# Patient Record
Sex: Male | Born: 1960 | Race: Black or African American | Hispanic: No | Marital: Single | Smoking: Current every day smoker
Health system: Southern US, Community
[De-identification: ages and names within clinical notes are randomized; demographics above are authoritative.]

## PROBLEM LIST (undated history)

## (undated) DIAGNOSIS — M109 Gout, unspecified: Secondary | ICD-10-CM

## (undated) DIAGNOSIS — M1990 Unspecified osteoarthritis, unspecified site: Secondary | ICD-10-CM

## (undated) DIAGNOSIS — K859 Acute pancreatitis without necrosis or infection, unspecified: Secondary | ICD-10-CM

## (undated) HISTORY — PX: WRIST SURGERY: SHX841

---

## 2004-06-06 ENCOUNTER — Emergency Department: Payer: Self-pay | Admitting: Emergency Medicine

## 2004-07-08 ENCOUNTER — Ambulatory Visit: Payer: Self-pay | Admitting: Unknown Physician Specialty

## 2004-08-24 ENCOUNTER — Emergency Department: Payer: Self-pay | Admitting: Emergency Medicine

## 2007-12-14 ENCOUNTER — Emergency Department: Payer: Self-pay

## 2008-12-11 ENCOUNTER — Encounter: Payer: Self-pay | Admitting: Orthopaedic Surgery

## 2008-12-24 ENCOUNTER — Encounter: Payer: Self-pay | Admitting: Orthopaedic Surgery

## 2009-01-24 ENCOUNTER — Encounter: Payer: Self-pay | Admitting: Orthopaedic Surgery

## 2010-05-14 ENCOUNTER — Emergency Department: Payer: Self-pay | Admitting: Emergency Medicine

## 2011-10-12 ENCOUNTER — Emergency Department: Payer: Self-pay | Admitting: Emergency Medicine

## 2011-10-12 LAB — COMPREHENSIVE METABOLIC PANEL
Albumin: 2.8 g/dL — ABNORMAL LOW (ref 3.4–5.0)
Alkaline Phosphatase: 91 U/L (ref 50–136)
Anion Gap: 8 (ref 7–16)
BUN: 7 mg/dL (ref 7–18)
Co2: 26 mmol/L (ref 21–32)
Creatinine: 0.89 mg/dL (ref 0.60–1.30)
EGFR (African American): >60
EGFR (Non-African Amer.): >60
Glucose: 114 mg/dL — ABNORMAL HIGH (ref 65–99)
Osmolality: 263 (ref 275–301)
SGPT (ALT): 22 U/L (ref 12–78)
Sodium: 132 mmol/L — ABNORMAL LOW (ref 136–145)
Total Protein: 9.1 g/dL — ABNORMAL HIGH (ref 6.4–8.2)

## 2011-10-12 LAB — URINALYSIS, COMPLETE
Bilirubin,UR: NEGATIVE
Nitrite: NEGATIVE
Ph: 5 (ref 4.5–8.0)
RBC,UR: NONE SEEN /HPF (ref 0–5)
Specific Gravity: 1.016 (ref 1.003–1.030)
Squamous Epithelial: NONE SEEN

## 2011-10-12 LAB — CBC
HCT: 41 % (ref 40.0–52.0)
HGB: 14 g/dL (ref 13.0–18.0)
MCH: 32.5 pg (ref 26.0–34.0)
MCV: 95 fL (ref 80–100)
Platelet: 336 10*3/uL (ref 150–440)
RDW: 14.4 % (ref 11.5–14.5)
WBC: 17.6 10*3/uL — ABNORMAL HIGH (ref 3.8–10.6)

## 2011-10-12 LAB — LIPASE, BLOOD: Lipase: 92 U/L (ref 73–393)

## 2011-10-14 LAB — URINE CULTURE

## 2012-08-28 ENCOUNTER — Emergency Department: Payer: Self-pay | Admitting: Emergency Medicine

## 2012-08-29 LAB — URIC ACID: Uric Acid: 8.2 mg/dL — ABNORMAL HIGH (ref 3.5–7.2)

## 2014-03-09 ENCOUNTER — Emergency Department: Payer: Self-pay | Admitting: Internal Medicine

## 2014-03-14 ENCOUNTER — Emergency Department: Payer: Self-pay | Admitting: Emergency Medicine

## 2014-03-17 ENCOUNTER — Emergency Department: Payer: Self-pay | Admitting: Emergency Medicine

## 2014-03-24 ENCOUNTER — Emergency Department: Payer: Self-pay | Admitting: Emergency Medicine

## 2014-03-26 ENCOUNTER — Emergency Department: Payer: Self-pay | Admitting: Emergency Medicine

## 2014-04-07 ENCOUNTER — Emergency Department: Payer: Self-pay | Admitting: Emergency Medicine

## 2014-05-13 NOTE — Consult Note (Signed)
Brief Consult Note: Diagnosis: epididymoorchitis.   Patient was seen by consultant.   Consult note dictated.   Recommend further assessment or treatment.   Discussed with Attending MD.   Comments: Needs GI f/u for colonoscopy for constip and bloody stools. Rx eporch rec [facility] f/u 10 days.  Electronic Signatures: Lattie Hawooper, Ricka Westra E (MD)  (Signed 19-Sep-13 00:46)  Authored: Brief Consult Note   Last Updated: 19-Sep-13 00:46 by Lattie Hawooper, Nilsa Macht E (MD)

## 2014-05-13 NOTE — Consult Note (Signed)
PATIENT NAME:  Philip Daniel, Philip Daniel MR#:  [id number] DATE OF BIRTH:  1960/12/01  DATE OF CONSULTATION:  10/13/2011  REFERRING PHYSICIAN:  Dorothea GlassmanPaul Malinda, MD  CONSULTING PHYSICIAN:  Adah Salvageichard E. Excell Seltzerooper, MD  CHIEF COMPLAINT: Bloody stools.   HISTORY OF PRESENT ILLNESS: This is a patient who has not had a bowel movement for three days and has had some minimal blood, bright red per rectum; but he also incidentally mentions that over the last four or five days he has had left groin pain. Dr. Darnelle CatalanMalinda called me to assist in the work-up of this patient, whose primary reason for coming to the Emergency Room was bloody bowel movements or bloody discharge and constipation, but who also has left groin pain. He has had a left inguinal hernia repair laparoscopically several years ago. He denies fevers, chills. No nausea or vomiting. He has not a bowel movement since Sunday.   PAST MEDICAL HISTORY: None.   PAST SURGICAL HISTORY: Left inguinal hernia repaired laparoscopically by Dr. Okey Duprerawford.   FAMILY HISTORY: Noncontributory.   SOCIAL HISTORY: The patient is unemployed. He smokes tobacco. He drinks alcohol often daily. He has never had a colonoscopy.   REVIEW OF SYSTEMS: Ten-system review is performed and negative with the exception of that mentioned in the history of present illness.   ALLERGIES: None.   MEDICATIONS: None.  PHYSICAL EXAMINATION:  GENERAL: A comfortable-appearing black male patient.   VITAL SIGNS: Temperature 96.9, pulse 97, respirations 18, blood pressure 120/81, 96% room air saturation. Pain scale was 10, it is much better now, he states possibly a 6.   HEENT: No scleral icterus. Poor dentition.  NECK: No palpable neck nodes.    CHEST: Clear to auscultation.   CARDIAC: Regular rate and rhythm.   ABDOMEN: Soft, nondistended, and nontender.   EXTREMITIES: Without edema. Calves are nontender.   NEUROLOGIC: Grossly intact.   RECTAL: Exam was performed by Dr. Darnelle CatalanMalinda and demonstrated  bloody mucus with guaiac-positive stools.   GENITOURINARY: Exam demonstrates no tenderness at the internal ring, but the cord down in the scrotum on the left side and the testicle are exquisitely tender and edematous and indurated. The overlying scrotum is not edematous or indurated and is soft and normal-appearing without erythema.   LABORATORY, DIAGNOSTIC AND RADIOLOGICAL DATA: CT scan is personally reviewed suggesting epididymoorchitis. There is also a recurrent left inguinal hernia that does not contain bowel. Urinalysis shows 2+ blood, 3+ leukocyte esterase. Ultrasound of the testicle shows normal flow. Electrolytes are within normal limits. White blood cell count 17.6, hemoglobin and hematocrit of 14 and 41.   ASSESSMENT AND PLAN: This is a patient with classic symptoms and signs of epididymoorchitis. He does have a recurrent inguinal hernia after laparoscopic repair. It does not contain bowel and is soft and nontender. His bigger problem is that of bloody stools. He has never had a colonoscopy. I recommended that he be arranged to have an outpatient GI consultation for colonoscopy. This was discussed with Dr. Darnelle CatalanMalinda, who agreed to arrange for outpatient GI consultation; and I also suggested both anti-inflammatories and antibiotics for the epididymoorchitis with follow-up in my office in 10 days.      The patient was in agreement with this, and Dr. Darnelle CatalanMalinda will make arrangements. ____________________________ Adah Salvageichard E. Excell Seltzerooper, MD rec:cbb D: 10/13/2011 00:51:48 ET T: 10/13/2011 13:27:50 ET JOB#: [id number]  cc: Adah Salvageichard E. Excell Seltzerooper, MD, <Dictator> Lattie HawICHARD E Daveon Arpino MD ELECTRONICALLY SIGNED 10/13/2011 19:18

## 2014-07-22 ENCOUNTER — Encounter: Payer: Self-pay | Admitting: Emergency Medicine

## 2014-07-22 ENCOUNTER — Emergency Department: Payer: Self-pay

## 2014-07-22 ENCOUNTER — Emergency Department
Admission: EM | Admit: 2014-07-22 | Discharge: 2014-07-22 | Disposition: A | Discharge status: Home / Self Care | Payer: Self-pay | Attending: Emergency Medicine | Admitting: Emergency Medicine

## 2014-07-22 DIAGNOSIS — Z720 Tobacco use: Secondary | ICD-10-CM | POA: Insufficient documentation

## 2014-07-22 DIAGNOSIS — M1712 Unilateral primary osteoarthritis, left knee: Secondary | ICD-10-CM | POA: Insufficient documentation

## 2014-07-22 DIAGNOSIS — M7122 Synovial cyst of popliteal space [Baker], left knee: Secondary | ICD-10-CM | POA: Insufficient documentation

## 2014-07-22 MED ORDER — PREDNISONE 20 MG PO TABS
60.0000 mg | ORAL_TABLET | Freq: Once | ORAL | Status: AC
  Administered 2014-07-22: 60 mg via ORAL

## 2014-07-22 MED ORDER — KETOROLAC TROMETHAMINE 60 MG/2ML IM SOLN
INTRAMUSCULAR | Status: AC
  Administered 2014-07-22: 60 mg via INTRAMUSCULAR
  Filled 2014-07-22: qty 2

## 2014-07-22 MED ORDER — HYDROCODONE-ACETAMINOPHEN 5-325 MG PO TABS
ORAL_TABLET | ORAL | Status: AC
  Administered 2014-07-22: 1 via ORAL
  Filled 2014-07-22: qty 1

## 2014-07-22 MED ORDER — KETOROLAC TROMETHAMINE 60 MG/2ML IM SOLN
60.0000 mg | Freq: Once | INTRAMUSCULAR | Status: AC
  Administered 2014-07-22: 60 mg via INTRAMUSCULAR

## 2014-07-22 MED ORDER — ORPHENADRINE CITRATE 30 MG/ML IJ SOLN
INTRAMUSCULAR | Status: AC
  Filled 2014-07-22: qty 2

## 2014-07-22 MED ORDER — HYDROCODONE-ACETAMINOPHEN 5-325 MG PO TABS
1.0000 | ORAL_TABLET | Freq: Once | ORAL | Status: AC
  Administered 2014-07-22: 1 via ORAL

## 2014-07-22 MED ORDER — IBUPROFEN 800 MG PO TABS: 800.0000 mg | ORAL_TABLET | Freq: Three times a day (TID) | ORAL | Status: DC | PRN

## 2014-07-22 MED ORDER — TRAMADOL HCL 50 MG PO TABS: 50.0000 mg | ORAL_TABLET | Freq: Four times a day (QID) | ORAL | Status: DC | PRN

## 2014-07-22 MED ORDER — PREDNISONE 20 MG PO TABS
ORAL_TABLET | ORAL | Status: AC
  Administered 2014-07-22: 60 mg via ORAL
  Filled 2014-07-22: qty 3

## 2014-07-22 NOTE — ED Notes (Signed)
Pt to ed with c/o left knee pain and swelling x 3 weeks.  Pt states unknown injury.

## 2014-07-22 NOTE — ED Notes (Signed)
Patient transported to X-ray 

## 2014-07-22 NOTE — ED Notes (Signed)
Left knee pain past 3 weeks

## 2014-07-22 NOTE — ED Provider Notes (Signed)
[facility] Emergency Department Provider Note  ____________________________________________  Time seen: 1212 I have reviewed the triage vital signs and the nursing notes.   HISTORY  Chief Complaint Knee Pain and Leg Swelling    HPI Philip EagleBarry W Davie Sr. is a 54 y.o. male arrives here today with a complaint of swelling around his left knee in the mass at the back of his left knee since he denies any trauma or injuries says it's been worseningover the past 3 weeks is here today for further evaluation and treatment rates pain as approximately a 8 out of 10 nothing making it particularly better or worse early has no other symptoms at this time denies lower extremity edema states that he sits [place] Time though he does develop a lot of pain   History reviewed. No pertinent past medical history.  There are no active problems to display for this patient.   History reviewed. No pertinent past surgical history.  Current Outpatient Rx  Name  Route  Sig  Dispense  Refill  . ibuprofen (ADVIL,MOTRIN) 800 MG tablet   Oral   Take 1 tablet (800 mg total) by mouth every 8 (eight) hours as needed.   60 tablet   0   . traMADol (ULTRAM) 50 MG tablet   Oral   Take 1 tablet (50 mg total) by mouth every 6 (six) hours as needed.   12 tablet   0     Allergies Review of patient's allergies indicates no known allergies.  No family history on file.  Social History History  Substance Use Topics  . Smoking status: Current Every Day Smoker  . Smokeless tobacco: Not on file  . Alcohol Use: Yes    Review of Systems Constitutional: No fever/chills Eyes: No visual changes. ENT: No sore throat. Cardiovascular: Denies chest pain. Respiratory: Denies shortness of breath. Gastrointestinal: No abdominal pain.  No nausea, no vomiting.  No diarrhea.  No constipation. Genitourinary: Negative for dysuria. Musculoskeletal: Negative for back pain. Skin: Negative for  rash. Neurological: Negative for headaches, focal weakness or numbness.  10-point ROS otherwise negative.  ____________________________________________   PHYSICAL EXAM:  VITAL SIGNS: ED Triage Vitals  Enc Vitals Group     BP 07/22/14 1201 133/83 mmHg     Pulse Rate 07/22/14 1201 94     Resp 07/22/14 1201 18     Temp 07/22/14 1201 98.1 F (36.7 C)     Temp Source 07/22/14 1201 Oral     SpO2 07/22/14 1201 100 %     Weight --      Height --      Head Cir --      Peak Flow --      Pain Score 07/22/14 1149 9     Pain Loc --      Pain Edu? --      Excl. in GC? --     Constitutional: Alert and oriented. Well appearing and in no acute distress. Eyes: Conjunctivae are normal. PERRL. EOMI. Head: Atraumatic. Nose: No congestion/rhinnorhea. Mouth/Throat: Mucous membranes are moist.  Oropharynx non-erythematous. Neck: No stridor.   Cardiovascular: Normal rate, regular rhythm. Grossly normal heart sounds.  Good peripheral circulation. Respiratory: Normal respiratory effort.  No retractions. Lungs CTAB. Gastrointestinal: Soft and nontender. No distention. No abdominal bruits. No CVA tenderness. Musculoskeletal: No lower extremity tenderness nor edema.  No joint effusions. Neurologic:  Normal speech and language. No gross focal neurologic deficits are appreciated. Speech is normal. No gait instability. Skin:  Skin is warm, dry and intact. No rash noted. Psychiatric: Mood and affect are normal. Speech and behavior are normal.  ____________________________________________   LABS (all labs ordered are listed, but only abnormal results are displayed)  Labs Reviewed - No data to display ____________________________________________  EKG   ____________________________________________  RADIOLOGY  X-rays the patient's knee showed  IMPRESSION: 1. Moderate knee effusion. 2. 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   Electronically Signed By: Philip Daniel M.D. On: 07/22/2014 13:19  Patient's ultrasound positive for Baker's cyst  I, Philip Daniel,  Philip Daniel, Philip Daniel, personally viewed and evaluated these images as part of my medical decision making.  ____________________________________________   PROCEDURES  Procedure(s) performed: Ace wrap was applied to the patient's left knee  Critical Care performed: No  ____________________________________________   INITIAL IMPRESSION / ASSESSMENT AND PLAN / ED COURSE  Pertinent labs & imaging results that were available during my care of the patient were reviewed by me and considered in my medical decision making (see chart for details). Initial impression arthritis of the patient's left knee with Baker's cyst recommended he take Reglan 10 inflammatory's ice the knee try to support his leg follow-up with orthopedics for further evaluation and definitive therapy long-term return here for any acute concerns or worsening symptoms explained to the patient would've Baker's cyst was and give him information on same  _________________________________________   FINAL CLINICAL IMPRESSION(S) / ED DIAGNOSES  Final diagnoses:  Arthritis of knee, left  Baker's cyst of knee, left     Philip Nordell Rosalyn Gess, PA-C 07/22/14 1521  Philip Cheek, MD 07/22/14 [phone]

## 2014-07-22 NOTE — Discharge Instructions (Signed)
Philip Daniel °A Philip Daniel is a sac-like structure that forms in the back of the knee. It is filled with the same fluid that is located in your knee. This fluid lubricates the bones and cartilage of the knee and allows them to move over each other more easily. °CAUSES  °When the knee becomes injured or inflamed, increased fluid forms in the knee. When this happens, the joint lining is pushed out behind the knee and forms the Philip Daniel. This Daniel may also be caused by inflammation from arthritic conditions and infections. °SIGNS AND SYMPTOMS  °A Philip Daniel usually has no symptoms. When the Daniel is substantially enlarged: °· You may feel pressure behind the knee, stiffness in the knee, or a mass in the area behind the knee. °· You may develop pain, redness, and swelling in the calf.  This can suggest a blood clot and requires evaluation by your health care provider. °DIAGNOSIS  °A Philip Daniel is most often found during an ultrasound exam. This exam may have been performed for other reasons, and the Daniel was found incidentally. Sometimes an MRI is used. This picks up other problems within a joint that an ultrasound exam may not. If the Philip Daniel developed immediately after an injury, X-ray exams may be used to diagnose the Daniel. °TREATMENT  °The treatment depends on the cause of the Daniel. Anti-inflammatory medicines and rest often will be prescribed. If the Daniel is caused by a bacterial infection, antibiotic medicines may be prescribed.  °HOME CARE INSTRUCTIONS  °· If the Daniel was caused by an injury, for the first 24 hours, keep the injured leg elevated on 2 pillows while lying down. °· For the first 24 hours while you are awake, apply ice to the injured area: °¨ Put ice in a plastic bag. °¨ Place a towel between your skin and the bag. °¨ Leave the ice on for 20 minutes, 2-3 times a day. °· Only take over-the-counter or prescription medicines for pain, discomfort, or fever as directed by your health care  provider. °· Only take antibiotic medicine as directed. Make sure to finish it even if you start to feel better. °MAKE SURE YOU:  °· Understand these instructions. °· Will watch your condition. °· Will get help right away if you are not doing well or get worse. °Document Released: 01/10/2005 Document Revised: 10/31/2012 Document Reviewed: 08/22/2012 °ExitCare® Patient Information ©2015 [employer]. This information is not intended to replace advice given to you by your health care provider. Make sure you discuss any questions you have with your health care provider. ° °

## 2014-12-16 ENCOUNTER — Encounter: Payer: Self-pay | Admitting: Emergency Medicine

## 2014-12-16 ENCOUNTER — Emergency Department
Admission: EM | Admit: 2014-12-16 | Discharge: 2014-12-16 | Disposition: A | Discharge status: Home / Self Care | Payer: Self-pay | Attending: Emergency Medicine | Admitting: Emergency Medicine

## 2014-12-16 DIAGNOSIS — F17200 Nicotine dependence, unspecified, uncomplicated: Secondary | ICD-10-CM | POA: Insufficient documentation

## 2014-12-16 DIAGNOSIS — L720 Epidermal cyst: Secondary | ICD-10-CM | POA: Insufficient documentation

## 2014-12-16 MED ORDER — SULFAMETHOXAZOLE-TRIMETHOPRIM 800-160 MG PO TABS: 1.0000 | ORAL_TABLET | Freq: Two times a day (BID) | ORAL | Status: DC

## 2014-12-16 MED ORDER — HYDROCODONE-ACETAMINOPHEN 5-325 MG PO TABS: 1.0000 | ORAL_TABLET | ORAL | Status: DC | PRN

## 2014-12-16 MED ORDER — LIDOCAINE-EPINEPHRINE (PF) 1 %-1:200000 IJ SOLN
INTRAMUSCULAR | Status: AC
  Administered 2014-12-16: 12:00:00
  Filled 2014-12-16: qty 30

## 2014-12-16 NOTE — ED Provider Notes (Signed)
[facility] Emergency Department Provider Note  ____________________________________________  Time seen: Approximately 1:18 PM  I have reviewed the triage vital signs and the nursing notes.   HISTORY  Chief Complaint Abscess  HPI Gareth EagleBarry W Sult Sr. is a 54 y.o. male who presents to the emergency department for evaluation of a cyst that has been present for the last several months. It has not been tender until the last few days, but now is becoming red and very sore. He does not have a history of skin infections or MRSA that he is aware of. He did have a cyst removed from his nose several years ago. He denies nausea vomiting or fever.  History reviewed. No pertinent past medical history.  There are no active problems to display for this patient.   History reviewed. No pertinent past surgical history.  Current Outpatient Rx  Name  Route  Sig  Dispense  Refill  . HYDROcodone-acetaminophen (NORCO/VICODIN) 5-325 MG tablet   Oral   Take 1 tablet by mouth every 4 (four) hours as needed.   12 tablet   0   . ibuprofen (ADVIL,MOTRIN) 800 MG tablet   Oral   Take 1 tablet (800 mg total) by mouth every 8 (eight) hours as needed.   60 tablet   0   . sulfamethoxazole-trimethoprim (BACTRIM DS,SEPTRA DS) 800-160 MG tablet   Oral   Take 1 tablet by mouth 2 (two) times daily.   20 tablet   0   . traMADol (ULTRAM) 50 MG tablet   Oral   Take 1 tablet (50 mg total) by mouth every 6 (six) hours as needed.   12 tablet   0     Allergies Review of patient's allergies indicates no known allergies.  No family history on file.  Social History Social History  Substance Use Topics  . Smoking status: Current Every Day Smoker  . Smokeless tobacco: None  . Alcohol Use: Yes    Review of Systems   Constitutional: No fever/chills Eyes: No visual changes. ENT: No congestion or rhinorrhea Cardiovascular: Denies chest pain. Respiratory: Denies shortness of  breath. Gastrointestinal: No abdominal pain.  No nausea, no vomiting.  No diarrhea.  No constipation. Genitourinary: Negative for dysuria. Musculoskeletal: Negative for back pain. Skin: Positive for abscess Neurological: Negative for headaches, focal weakness or numbness.  10-point ROS otherwise negative.  ____________________________________________   PHYSICAL EXAM:  VITAL SIGNS: ED Triage Vitals  Enc Vitals Group     BP 12/16/14 1204 142/87 mmHg     Pulse Rate 12/16/14 1204 87     Resp 12/16/14 1204 18     Temp 12/16/14 1204 98.5 F (36.9 C)     Temp Source 12/16/14 1204 Oral     SpO2 12/16/14 1204 95 %     Weight 12/16/14 1204 170 lb (77.111 kg)     Height 12/16/14 1204 5\' 10"  (1.778 m)     Head Cir --      Peak Flow --      Pain Score 12/16/14 1204 8     Pain Loc --      Pain Edu? --      Excl. in GC? --     Constitutional: Alert and oriented. Well appearing and in no acute distress. Eyes: Conjunctivae are normal. PERRL. EOMI. Head: Atraumatic. Nose: No congestion/rhinnorhea. Mouth/Throat: Mucous membranes are moist.  Oropharynx non-erythematous. No oral lesions. Neck: No stridor. Cardiovascular: Normal rate, regular rhythm.  Good peripheral circulation. Respiratory: Normal respiratory  effort.  No retractions. Lungs CTAB. Gastrointestinal: Soft and nontender. No distention. No abdominal bruits.  Musculoskeletal: No lower extremity tenderness nor edema.  No joint effusions. Neurologic:  Normal speech and language. No gross focal neurologic deficits are appreciated. Speech is normal. No gait instability. Skin:  4 cm raised epidermal cyst noted to the upper back adjacent to the right scapula with erythema, fluctuance and tenderness; Negative for petechiae.  Psychiatric: Mood and affect are normal. Speech and behavior are normal.  ____________________________________________   LABS (all labs ordered are listed, but only abnormal results are displayed)  Labs  Reviewed - No data to display ____________________________________________  EKG   ____________________________________________  RADIOLOGY   ____________________________________________   PROCEDURES  Procedure(s) performed:   Epidermal cyst was excised after cleansing the back with Betadine. #11 blade used 2 make a 1-1/2 cm incision. Cyst wall was ruptured during the process and copious amounts of purulent drainage and dark brown, thick material was expressed. The cyst wall was revealed after contents were expressed and wall was removed. There was no bleeding after the procedure. Patient tolerated the procedure well. ____________________________________________   INITIAL IMPRESSION / ASSESSMENT AND PLAN / ED COURSE  Pertinent labs & imaging results that were available during my care of the patient were reviewed by me and considered in my medical decision making (see chart for details).  Because the cyst ruptured during excision, he will be prescribed Bactrim. He will also be given a three-day dose of hydrocodone. He was advised to follow-up with the primary care provider or return to the emergency department for any symptom of concern. I did symptoms of infection were reviewed with the patient. ____________________________________________   FINAL CLINICAL IMPRESSION(S) / ED DIAGNOSES  Final diagnoses:  Epidermal cyst       Chinita Pester, FNP 12/16/14 1324  Jene Every, MD 12/16/14 1332

## 2014-12-16 NOTE — ED Notes (Signed)
Pt to ed with c/o abscess to upper back.  Pt states the area has been swollen for several months.  Pt reports only started hurting in the last few days.  Pt skin with mild redness. Tender to palpation.

## 2014-12-16 NOTE — Discharge Instructions (Signed)
Epidermal Cyst An epidermal cyst is sometimes called a sebaceous cyst, epidermal inclusion cyst, or infundibular cyst. These cysts usually contain a substance that looks "pasty" or "cheesy" and may have a bad smell. This substance is a protein called keratin. Epidermal cysts are usually found on the face, neck, or trunk. They may also occur in the vaginal area or other parts of the genitalia of both men and women. Epidermal cysts are usually small, painless, slow-growing bumps or lumps that move freely under the skin. It is important not to try to pop them. This may cause an infection and lead to tenderness and swelling. CAUSES  Epidermal cysts may be caused by a deep penetrating injury to the skin or a plugged hair follicle, often associated with acne. SYMPTOMS  Epidermal cysts can become inflamed and cause:  Redness.  Tenderness.  Increased temperature of the skin over the bumps or lumps.  Grayish-white, bad smelling material that drains from the bump or lump. DIAGNOSIS  Epidermal cysts are easily diagnosed by your caregiver during an exam. Rarely, a tissue sample (biopsy) may be taken to rule out other conditions that may resemble epidermal cysts. TREATMENT   Epidermal cysts often get better and disappear on their own. They are rarely ever cancerous.  If a cyst becomes infected, it may become inflamed and tender. This may require opening and draining the cyst. Treatment with antibiotics may be necessary. When the infection is gone, the cyst may be removed with minor surgery.  Small, inflamed cysts can often be treated with antibiotics or by injecting steroid medicines.  Sometimes, epidermal cysts become large and bothersome. If this happens, surgical removal in your caregiver's office may be necessary. HOME CARE INSTRUCTIONS  Only take over-the-counter or prescription medicines as directed by your caregiver.  Take your antibiotics as directed. Finish them even if you start to feel  better. SEEK MEDICAL CARE IF:   Your cyst becomes tender, red, or swollen.  Your condition is not improving or is getting worse.  You have any other questions or concerns. MAKE SURE YOU:  Understand these instructions.  Will watch your condition.  Will get help right away if you are not doing well or get worse.   This information is not intended to replace advice given to you by your health care provider. Make sure you discuss any questions you have with your health care provider.   Document Released: 12/12/2003 Document Revised: 04/04/2011 Document Reviewed: 07/19/2010 Elsevier Interactive Patient Education 2016 [employer]  Sebaceous Cyst Removal Sebaceous cyst removal is a procedure to remove a sac of oily material that forms under your skin (sebaceous cyst). Sebaceous cysts may also be called epidermoid cysts or keratin cysts. Normally, the skin secretes this oily material through a gland or a hair follicle. This type of cyst usually results when a skin gland or hair follicle becomes blocked. You may need this procedure if you have a sebaceous cyst that becomes large, uncomfortable, or infected. LET YOUR HEALTH CARE PROVIDER KNOW ABOUT:  Any allergies you have.  All medicines you are taking, including vitamins, herbs, eye drops, creams, and over-the-counter medicines.  Previous problems you or members of your family have had with the use of anesthetics.  Any blood disorders you have.  Previous surgeries you have had.  Medical conditions you have. RISKS AND COMPLICATIONS Generally, this is a safe procedure. However, problems may occur, including:  Developing another cyst.  Bleeding.  Infection.  Scarring. BEFORE THE PROCEDURE  Ask your health   care provider about:  Changing or stopping your regular medicines. This is especially important if you are taking diabetes medicines or blood thinners.  Taking medicines such as aspirin and ibuprofen. These medicines  can thin your blood. Do not take these medicines before your procedure if your health care provider instructs you not to.  If you have an infected cyst, you may have to take antibiotic medicines before or after the cyst removal. Take your antibiotics as directed by your health care provider. Finish all of the medicine even if you start to feel better.  Take a shower on the morning of your procedure. Your health care provider may ask you to use a germ-killing (antiseptic) soap. PROCEDURE  You will be given a medicine that numbs the area (local anesthetic).  The skin around the cyst will be cleaned with a germ-killing solution (antiseptic).  Your health care provider will make a small surgical incision over the cyst.  The cyst will be separated from the surrounding tissues that are under your skin.  If possible, the cyst will be removed undamaged (intact).  If the cyst bursts (ruptures), it will need to be removed in pieces.  After the cyst is removed, your health care provider will control any bleeding and close the incision with small stitches (sutures). Small incisions may not need sutures, and the bleeding will be controlled by applying direct pressure with gauze.  Your health care provider may apply antibiotic ointment and a light bandage (dressing) over the incision. This procedure may vary among health care providers and hospitals. AFTER THE PROCEDURE  If your cyst ruptured during surgery, you may need to take antibiotic medicine. If you were prescribed an antibiotic medicine, finish all of it even if you start to feel better.   This information is not intended to replace advice given to you by your health care provider. Make sure you discuss any questions you have with your health care provider.   Document Released: 01/08/2000 Document Revised: 01/31/2014 Document Reviewed: 09/25/2013 Elsevier Interactive Patient Education 2016 Elsevier Inc.   

## 2014-12-16 NOTE — ED Notes (Signed)
Pt c/o "cyst" to back for a couple of months that is getting worse

## 2015-07-04 ENCOUNTER — Emergency Department
Admission: EM | Admit: 2015-07-04 | Discharge: 2015-07-04 | Disposition: A | Discharge status: Home / Self Care | Payer: Self-pay | Attending: Emergency Medicine | Admitting: Emergency Medicine

## 2015-07-04 ENCOUNTER — Encounter: Payer: Self-pay | Admitting: Emergency Medicine

## 2015-07-04 ENCOUNTER — Emergency Department: Payer: Self-pay

## 2015-07-04 DIAGNOSIS — M1990 Unspecified osteoarthritis, unspecified site: Secondary | ICD-10-CM | POA: Insufficient documentation

## 2015-07-04 DIAGNOSIS — M25562 Pain in left knee: Secondary | ICD-10-CM | POA: Insufficient documentation

## 2015-07-04 DIAGNOSIS — F17210 Nicotine dependence, cigarettes, uncomplicated: Secondary | ICD-10-CM | POA: Insufficient documentation

## 2015-07-04 HISTORY — DX: Gout, unspecified: M10.9

## 2015-07-04 HISTORY — DX: Unspecified osteoarthritis, unspecified site: M19.90

## 2015-07-04 MED ORDER — IBUPROFEN 800 MG PO TABS: 800.0000 mg | ORAL_TABLET | Freq: Three times a day (TID) | ORAL | Status: DC | PRN

## 2015-07-04 MED ORDER — HYDROCODONE-ACETAMINOPHEN 5-325 MG PO TABS: 1.0000 | ORAL_TABLET | ORAL | Status: DC | PRN

## 2015-07-04 MED ORDER — OXYCODONE-ACETAMINOPHEN 5-325 MG PO TABS
2.0000 | ORAL_TABLET | Freq: Once | ORAL | Status: AC
  Administered 2015-07-04: 2 via ORAL
  Filled 2015-07-04: qty 2

## 2015-07-04 MED ORDER — CEPHALEXIN 500 MG PO CAPS: 500.0000 mg | ORAL_CAPSULE | Freq: Four times a day (QID) | ORAL | Status: DC

## 2015-07-04 NOTE — Discharge Instructions (Signed)
Cryotherapy °Cryotherapy means treatment with cold. Ice or gel packs can be used to reduce both pain and swelling. Ice is the most helpful within the first 24 to 48 hours after an injury or flare-up from overusing a muscle or joint. Sprains, strains, spasms, burning pain, shooting pain, and aches can all be eased with ice. Ice can also be used when recovering from surgery. Ice is effective, has very few side effects, and is safe for most people to use. °PRECAUTIONS  °Ice is not a safe treatment option for people with: °· Raynaud phenomenon. This is a condition affecting small blood vessels in the extremities. Exposure to cold may cause your problems to return. °· Cold hypersensitivity. There are many forms of cold hypersensitivity, including: °· Cold urticaria. Red, itchy hives appear on the skin when the tissues begin to warm after being iced. °· Cold erythema. This is a red, itchy rash caused by exposure to cold. °· Cold hemoglobinuria. Red blood cells break down when the tissues begin to warm after being iced. The hemoglobin that carry oxygen are passed into the urine because they cannot combine with blood proteins fast enough. °· Numbness or altered sensitivity in the area being iced. °If you have any of the following conditions, do not use ice until you have discussed cryotherapy with your caregiver: °· Heart conditions, such as arrhythmia, angina, or chronic heart disease. °· High blood pressure. °· Healing wounds or open skin in the area being iced. °· Current infections. °· Rheumatoid arthritis. °· Poor circulation. °· Diabetes. °Ice slows the blood flow in the region it is applied. This is beneficial when trying to stop inflamed tissues from spreading irritating chemicals to surrounding tissues. However, if you expose your skin to cold temperatures for too long or without the proper protection, you can damage your skin or nerves. Watch for signs of skin damage due to cold. °HOME CARE INSTRUCTIONS °Follow  these tips to use ice and cold packs safely. °· Place a dry or damp towel between the ice and skin. A damp towel will cool the skin more quickly, so you may need to shorten the time that the ice is used. °· For a more rapid response, add gentle compression to the ice. °· Ice for no more than 10 to 20 minutes at a time. The bonier the area you are icing, the less time it will take to get the benefits of ice. °· Check your skin after 5 minutes to make sure there are no signs of a poor response to cold or skin damage. °· Rest 20 minutes or more between uses. °· Once your skin is numb, you can end your treatment. You can test numbness by very lightly touching your skin. The touch should be so light that you do not see the skin dimple from the pressure of your fingertip. When using ice, most people will feel these normal sensations in this order: cold, burning, aching, and numbness. °· Do not use ice on someone who cannot communicate their responses to pain, such as small children or people with dementia. °HOW TO MAKE AN ICE PACK °Ice packs are the most common way to use ice therapy. Other methods include ice massage, ice baths, and cryosprays. Muscle creams that cause a cold, tingly feeling do not offer the same benefits that ice offers and should not be used as a substitute unless recommended by your caregiver. °To make an ice pack, do one of the following: °· Place crushed ice or a   bag of frozen vegetables in a sealable plastic bag. Squeeze out the excess air. Place this bag inside another plastic bag. Slide the bag into a pillowcase or place a damp towel between your skin and the bag.  Mix 3 parts water with 1 part rubbing alcohol. Freeze the mixture in a sealable plastic bag. When you remove the mixture from the freezer, it will be slushy. Squeeze out the excess air. Place this bag inside another plastic bag. Slide the bag into a pillowcase or place a damp towel between your skin and the bag. SEEK MEDICAL CARE  IF:  You develop white spots on your skin. This may give the skin a blotchy (mottled) appearance.  Your skin turns blue or pale.  Your skin becomes waxy or hard.  Your swelling gets worse. MAKE SURE YOU:   Understand these instructions.  Will watch your condition.  Will get help right away if you are not doing well or get worse.   This information is not intended to replace advice given to you by your health care provider. Make sure you discuss any questions you have with your health care provider.   Document Released: 09/06/2010 Document Revised: 01/31/2014 Document Reviewed: 09/06/2010 Elsevier Interactive Patient Education 2016 [employer]  Knee Pain Knee pain is a very common symptom and can have many causes. Knee pain often goes away when you follow your health care provider's instructions for relieving pain and discomfort at home. However, knee pain can develop into a condition that needs treatment. Some conditions may include:  Arthritis caused by wear and tear (osteoarthritis).  Arthritis caused by swelling and irritation (rheumatoid arthritis or gout).  A cyst or growth in your knee.  An infection in your knee joint.  An injury that will not heal.  Damage, swelling, or irritation of the tissues that support your knee (torn ligaments or tendinitis). If your knee pain continues, additional tests may be ordered to diagnose your condition. Tests may include X-rays or other imaging studies of your knee. You may also need to have fluid removed from your knee. Treatment for ongoing knee pain depends on the cause, but treatment may include:  Medicines to relieve pain or swelling.  Steroid injections in your knee.  Physical therapy.  Surgery. HOME CARE INSTRUCTIONS  Take medicines only as directed by your health care provider.  Rest your knee and keep it raised (elevated) while you are resting.  Do not do things that cause or worsen pain.  Avoid high-impact  activities or exercises, such as running, jumping rope, or doing jumping jacks.  Apply ice to the knee area:  Put ice in a plastic bag.  Place a towel between your skin and the bag.  Leave the ice on for 20 minutes, 2-3 times a day.  Ask your health care provider if you should wear an elastic knee support.  Keep a pillow under your knee when you sleep.  Lose weight if you are overweight. Extra weight can put pressure on your knee.  Do not use any tobacco products, including cigarettes, chewing tobacco, or electronic cigarettes. If you need help quitting, ask your health care provider. Smoking may slow the healing of any bone and joint problems that you may have. SEEK MEDICAL CARE IF:  Your knee pain continues, changes, or gets worse.  You have a fever along with knee pain.  Your knee buckles or locks up.  Your knee becomes more swollen. SEEK IMMEDIATE MEDICAL CARE IF:   Your knee  joint feels hot to the touch. °· You have chest pain or trouble breathing. °  °This information is not intended to replace advice given to you by your health care provider. Make sure you discuss any questions you have with your health care provider. °  °Document Released: 11/07/2006 Document Revised: 01/31/2014 Document Reviewed: 08/26/2013 °Elsevier Interactive Patient Education ©2016 Elsevier Inc. ° °

## 2015-07-04 NOTE — ED Provider Notes (Signed)
[facility] Emergency Department Provider Note  ____________________________________________  Time seen: Approximately 8:14 AM  I have reviewed the triage vital signs and the nursing notes.   HISTORY  Chief Complaint Knee Pain    HPI Philip EagleBarry W Elbe Sr. is a 55 y.o. male who presents emergency room for evaluation of left knee pain and swelling started 2 days ago. Patient states he usually uses knee injected with steroids about every 3 months with pressure but missed his last appointment.   Past Medical History  Diagnosis Date  . Arthritis   . Gout     There are no active problems to display for this patient.   Past Surgical History  Procedure Laterality Date  . Wrist surgery      Current Outpatient Rx  Name  Route  Sig  Dispense  Refill  . HYDROcodone-acetaminophen (NORCO) 5-325 MG tablet   Oral   Take 1-2 tablets by mouth every 4 (four) hours as needed for moderate pain.   15 tablet   0   . ibuprofen (ADVIL,MOTRIN) 800 MG tablet   Oral   Take 1 tablet (800 mg total) by mouth every 8 (eight) hours as needed.   30 tablet   0     Allergies Review of patient's allergies indicates no known allergies.  No family history on file.  Social History Social History  Substance Use Topics  . Smoking status: Current Every Day Smoker -- 1.00 packs/day    Types: Cigarettes  . Smokeless tobacco: None  . Alcohol Use: 2.4 oz/week    4 Cans of beer per week    Review of Systems Constitutional: No fever/chills Cardiovascular: Denies chest pain. Respiratory: Denies shortness of breath. Musculoskeletal: Positive for left knee pain and swelling. Skin: Negative for rash. Neurological: Negative for headaches, focal weakness or numbness.  10-point ROS otherwise negative.  ____________________________________________   PHYSICAL EXAM: BP 128/100 mmHg  Pulse 89  Temp(Src) 98.9 F (37.2 C) (Oral)  Resp 20  Ht 5\' 10"  (1.778 m)  Wt 77.111 kg   BMI 24.39 kg/m2  SpO2 96%  VITAL SIGNS: ED Triage Vitals  Enc Vitals Group     BP --      Pulse --      Resp --      Temp --      Temp src --      SpO2 --      Weight --      Height --      Head Cir --      Peak Flow --      Pain Score --      Pain Loc --      Pain Edu? --      Excl. in [place]? --     Constitutional: Alert and oriented. Well appearing and in no acute distress.   Cardiovascular: Normal rate, regular rhythm. Grossly normal heart sounds.  Good peripheral circulation. Respiratory: Normal respiratory effort.  No retractions. Lungs CTAB. Musculoskeletal: Left knee with positive edema and swelling and minimal warmth. Full range of motion, point tenderness noted. No erythema. Neurologic:  Normal speech and language. No gross focal neurologic deficits are appreciated. No gait instability. Skin:  Skin is warm, dry and intact. No rash noted. Psychiatric: Mood and affect are normal. Speech and behavior are normal.  ____________________________________________   LABS (all labs ordered are listed, but only abnormal results are displayed)  Labs Reviewed - No data to display ____________________________________________   RADIOLOGY  IMPRESSION: 1. Interval development of a large joint effusion. Differential considerations include degenerative effusion, infection (septic joint) and spontaneous hemarthrosis. 2. 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 3. Atherosclerotic vascular calcifications. ____________________________________________   PROCEDURES  Procedure(s) performed: None  Critical Care performed: No  ____________________________________________   INITIAL IMPRESSION / ASSESSMENT AND PLAN / ED COURSE  Pertinent labs & imaging results that were available during my care of the patient were reviewed by me and considered in my medical decision making (see chart  for details).  Recurrent and ongoing left knee pain. Patient currently has orthopedic doctor that he sees on a regular basis with follow-up with him. Rx given for Percocet 5/325 and ibuprofen 800 mg 3 times a day and Keflex 500 mg QID . Patient voices no other emergency medical complaints at this time. ____________________________________________   FINAL CLINICAL IMPRESSION(S) / ED DIAGNOSES  Final diagnoses:  Left knee pain     This chart was dictated using voice recognition software/Dragon. Despite best efforts to proofread, errors can occur which can change the meaning. Any change was purely unintentional.   Evangeline Dakin, PA-C 07/04/15 4098  Minna Antis, MD 07/04/15 1450

## 2015-07-04 NOTE — ED Notes (Signed)
Pt to ED today with Left knee pain and swelling that started 2 days ago.  Pt states he get "injections" in his Left knee every 3 months for the past year, but missed his last appointment.

## 2018-08-16 ENCOUNTER — Emergency Department: Payer: No Typology Code available for payment source

## 2018-08-16 ENCOUNTER — Other Ambulatory Visit: Payer: Self-pay

## 2018-08-16 ENCOUNTER — Emergency Department
Admission: EM | Admit: 2018-08-16 | Discharge: 2018-08-16 | Discharge status: Against Medical Advice | Payer: No Typology Code available for payment source | Attending: Emergency Medicine | Admitting: Emergency Medicine

## 2018-08-16 ENCOUNTER — Encounter: Payer: Self-pay | Admitting: Emergency Medicine

## 2018-08-16 DIAGNOSIS — M542 Cervicalgia: Secondary | ICD-10-CM

## 2018-08-16 DIAGNOSIS — Y999 Unspecified external cause status: Secondary | ICD-10-CM | POA: Insufficient documentation

## 2018-08-16 DIAGNOSIS — Y9389 Activity, other specified: Secondary | ICD-10-CM | POA: Insufficient documentation

## 2018-08-16 DIAGNOSIS — Z5329 Procedure and treatment not carried out because of patient's decision for other reasons: Secondary | ICD-10-CM | POA: Diagnosis not present

## 2018-08-16 DIAGNOSIS — F17210 Nicotine dependence, cigarettes, uncomplicated: Secondary | ICD-10-CM | POA: Diagnosis not present

## 2018-08-16 DIAGNOSIS — Y92410 Unspecified street and highway as the place of occurrence of the external cause: Secondary | ICD-10-CM | POA: Diagnosis not present

## 2018-08-16 DIAGNOSIS — R51 Headache: Secondary | ICD-10-CM | POA: Insufficient documentation

## 2018-08-16 DIAGNOSIS — M545 Low back pain, unspecified: Secondary | ICD-10-CM

## 2018-08-16 MED ORDER — CYCLOBENZAPRINE HCL 5 MG PO TABS: ORAL_TABLET | ORAL | 0 refills | Status: DC

## 2018-08-16 MED ORDER — OXYCODONE-ACETAMINOPHEN 5-325 MG PO TABS
1.0000 | ORAL_TABLET | Freq: Once | ORAL | Status: AC
  Administered 2018-08-16: 18:00:00 1 via ORAL
  Filled 2018-08-16: qty 1

## 2018-08-16 MED ORDER — KETOROLAC TROMETHAMINE 30 MG/ML IJ SOLN
30.0000 mg | Freq: Once | INTRAMUSCULAR | Status: AC
  Administered 2018-08-16: 30 mg via INTRAMUSCULAR
  Filled 2018-08-16: qty 1

## 2018-08-16 MED ORDER — LIDOCAINE 5 % EX PTCH: 1.0000 | MEDICATED_PATCH | Freq: Two times a day (BID) | CUTANEOUS | 0 refills | Status: DC

## 2018-08-16 MED ORDER — LIDOCAINE 5 % EX PTCH
1.0000 | MEDICATED_PATCH | CUTANEOUS | Status: DC
  Administered 2018-08-16: 1 via TRANSDERMAL
  Filled 2018-08-16: qty 1
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
## 2018-08-16 NOTE — Discharge Instructions (Signed)
I recommend that you stay for a CT scan.  You can take muscle relaxers as needed for muscle spasms.  Please return to the emergency department if pain or symptoms worsen.

## 2018-08-16 NOTE — ED Triage Notes (Addendum)
Entered in error on wrong patient
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
## 2018-08-16 NOTE — ED Notes (Signed)
Notified nurse that brother Merry Proud at [phone] needs him to call to update

## 2018-09-10 ENCOUNTER — Encounter: Payer: Self-pay | Admitting: Emergency Medicine

## 2018-09-10 ENCOUNTER — Emergency Department: Payer: Self-pay

## 2018-09-10 ENCOUNTER — Emergency Department
Admission: EM | Admit: 2018-09-10 | Discharge: 2018-09-10 | Disposition: A | Discharge status: Home / Self Care | Payer: Self-pay | Attending: Emergency Medicine | Admitting: Emergency Medicine

## 2018-09-10 DIAGNOSIS — F17210 Nicotine dependence, cigarettes, uncomplicated: Secondary | ICD-10-CM | POA: Insufficient documentation

## 2018-09-10 DIAGNOSIS — K2920 Alcoholic gastritis without bleeding: Secondary | ICD-10-CM | POA: Insufficient documentation

## 2018-09-10 HISTORY — DX: Acute pancreatitis without necrosis or infection, unspecified: K85.90

## 2018-09-10 LAB — LIPASE, BLOOD: Lipase: 63 U/L — ABNORMAL HIGH (ref 11–51)

## 2018-09-10 LAB — URINALYSIS, COMPLETE (UACMP) WITH MICROSCOPIC
Bacteria, UA: NONE SEEN
Bilirubin Urine: NEGATIVE
Glucose, UA: NEGATIVE mg/dL
Hgb urine dipstick: NEGATIVE
Ketones, ur: NEGATIVE mg/dL
Leukocytes,Ua: NEGATIVE
Nitrite: NEGATIVE
Protein, ur: NEGATIVE mg/dL
Specific Gravity, Urine: 1.009 (ref 1.005–1.030)
pH: 7 (ref 5.0–8.0)

## 2018-09-10 LAB — BASIC METABOLIC PANEL
Anion gap: 10 (ref 5–15)
BUN: 5 mg/dL — ABNORMAL LOW (ref 6–20)
CO2: 23 mmol/L (ref 22–32)
Calcium: 8.1 mg/dL — ABNORMAL LOW (ref 8.9–10.3)
Chloride: 97 mmol/L — ABNORMAL LOW (ref 98–111)
Creatinine, Ser: 0.89 mg/dL (ref 0.61–1.24)
GFR calc Af Amer: >60 mL/min (ref >60)
GFR calc non Af Amer: >60 mL/min (ref >60)
Glucose, Bld: 88 mg/dL (ref 70–99)
Potassium: 3.2 mmol/L — ABNORMAL LOW (ref 3.5–5.1)
Sodium: 130 mmol/L — ABNORMAL LOW (ref 135–145)

## 2018-09-10 LAB — CBC
HCT: 32.4 % — ABNORMAL LOW (ref 39.0–52.0)
Hemoglobin: 11.2 g/dL — ABNORMAL LOW (ref 13.0–17.0)
MCH: 32.6 pg (ref 26.0–34.0)
MCHC: 34.6 g/dL (ref 30.0–36.0)
MCV: 94.2 fL (ref 80.0–100.0)
Platelets: 328 10*3/uL (ref 150–400)
RBC: 3.44 MIL/uL — ABNORMAL LOW (ref 4.22–5.81)
RDW: 14.5 % (ref 11.5–15.5)
WBC: 7.6 10*3/uL (ref 4.0–10.5)
nRBC: 0 % (ref 0.0–0.2)

## 2018-09-10 LAB — TROPONIN I (HIGH SENSITIVITY): Troponin I (High Sensitivity): 4 ng/L (ref <18)

## 2018-09-10 MED ORDER — PEPTO-BISMOL 524 MG/30ML PO SUSP: 30.0000 mL | Freq: Four times a day (QID) | ORAL | 2 refills | Status: DC | PRN

## 2018-09-10 MED ORDER — ALUM & MAG HYDROXIDE-SIMETH 200-200-20 MG/5ML PO SUSP
30.0000 mL | Freq: Once | ORAL | Status: AC
  Administered 2018-09-10: 19:00:00 30 mL via ORAL
  Filled 2018-09-10: qty 30

## 2018-09-10 MED ORDER — LIDOCAINE VISCOUS HCL 2 % MT SOLN
15.0000 mL | Freq: Once | OROMUCOSAL | Status: AC
  Administered 2018-09-10: 20:00:00 15 mL via ORAL
  Filled 2018-09-10: qty 15

## 2018-09-10 NOTE — ED Provider Notes (Signed)
[facility] Emergency Department Provider Note   ____________________________________________    I have reviewed the triage vital signs and the nursing notes.   HISTORY  Chief Complaint Abdominal pain    HPI Philip PINN Sr. is a 59 y.o. male who presents with complaints of upper abdominal pain.  Patient reports he has had burning upper abdominal pain since this morning.  Does admit to significant alcohol consumption this weekend.  Denies dark tarry stools, no vomiting.  Has not take anything for this.  Does report a history of pancreatitis in the past.  No fevers or chills.  Denies chest pain no shortness of breath  Past Medical History:  Diagnosis Date  . Arthritis   . Gout   . Pancreatitis     There are no active problems to display for this patient.   Past Surgical History:  Procedure Laterality Date  . WRIST SURGERY      Prior to Admission medications   Medication Sig Start Date End Date Taking? Authorizing Provider  bismuth subsalicylate (PEPTO-BISMOL) 262 MG/15ML suspension Take 30 mLs by mouth 4 (four) times daily as needed. 09/10/18 09/10/19  Lavonia Drafts, MD  cyclobenzaprine (FLEXERIL) 5 MG tablet Take 1-2 tablets 3 times daily as needed 08/16/18   Laban Emperor, PA-C  lidocaine (LIDODERM) 5 % Place 1 patch onto the skin every 12 (twelve) hours. Remove & Discard patch within 12 hours or as directed by MD 08/16/18 08/16/19  Laban Emperor, PA-C     Allergies Patient has no known allergies.  No family history on file.  Social History Social History   Tobacco Use  . Smoking status: Current Every Day Smoker    Packs/day: 1.00    Types: Cigarettes  . Smokeless tobacco: Never Used  Substance Use Topics  . Alcohol use: Yes    Alcohol/week: 4.0 standard drinks    Types: 4 Cans of beer per week  . Drug use: No    Review of Systems  Constitutional: No fever/chills Eyes: No visual changes.  ENT: No sore throat.  Cardiovascular: Denies chest pain. Respiratory: Denies shortness of breath. Gastrointestinal: As above Genitourinary: Negative for dysuria. Musculoskeletal: Negative for back pain. Skin: Negative for rash. Neurological: Negative for headaches   ____________________________________________   PHYSICAL EXAM:  VITAL SIGNS: ED Triage Vitals  Enc Vitals Group     BP 09/10/18 1616 (!) 150/84     Pulse Rate 09/10/18 1616 73     Resp 09/10/18 1616 16     Temp 09/10/18 1616 98.6 F (37 C)     Temp Source 09/10/18 1616 Oral     SpO2 09/10/18 1616 99 %     Weight --      Height --      Head Circumference --      Peak Flow --      Pain Score 09/10/18 1617 10     Pain Loc --      Pain Edu? --      Excl. in [place]? --     Constitutional: Alert and oriented. Eyes: Conjunctivae are normal.  Head: Atraumatic. Nose: No congestion/rhinnorhea. Mouth/Throat: Mucous membranes are moist.   Neck:  Painless ROM Cardiovascular: Normal rate, regular rhythm. Grossly normal heart sounds.  Good peripheral circulation. Respiratory: Normal respiratory effort.  No retractions. Lungs CTAB. Gastrointestinal: Soft and nontender. No distention.  No CVA tenderness. Genitourinary: deferred Musculoskeletal: No lower extremity tenderness nor edema.  Warm and well perfused Neurologic:  Normal speech  and language. No gross focal neurologic deficits are appreciated.  Skin:  Skin is warm, dry and intact. No rash noted. Psychiatric: Mood and affect are normal. Speech and behavior are normal.  ____________________________________________   LABS (all labs ordered are listed, but only abnormal results are displayed)  Labs Reviewed  BASIC METABOLIC PANEL - Abnormal; Notable for the following components:      Result Value   Sodium 130 (*)    Potassium 3.2 (*)    Chloride 97 (*)    BUN 5 (*)    Calcium 8.1 (*)    All other components within normal limits  CBC - Abnormal; Notable for the following components:    RBC 3.44 (*)    Hemoglobin 11.2 (*)    HCT 32.4 (*)    All other components within normal limits  LIPASE, BLOOD - Abnormal; Notable for the following components:   Lipase 63 (*)    All other components within normal limits  URINALYSIS, COMPLETE (UACMP) WITH MICROSCOPIC - Abnormal; Notable for the following components:   Color, Urine YELLOW (*)    APPearance CLEAR (*)    All other components within normal limits  TROPONIN I (HIGH SENSITIVITY)   ____________________________________________  EKG  ED ECG REPORT I, Jene Everyobert Chanya Chrisley, the attending physician, personally viewed and interpreted this ECG.  Date: 09/10/2018  Rhythm: normal sinus rhythm QRS Axis: normal Intervals: Prolonged QT ST/T Wave abnormalities: normal Narrative Interpretation: no evidence of acute ischemia  ____________________________________________  RADIOLOGY  Chest x-ray unremarkable ____________________________________________   PROCEDURES  Procedure(s) performed: No  Procedures   Critical Care performed: No ____________________________________________   INITIAL IMPRESSION / ASSESSMENT AND PLAN / ED COURSE  Pertinent labs & imaging results that were available during my care of the patient were reviewed by me and considered in my medical decision making (see chart for details).  Patient presents with upper abdominal pain suspicious for gastritis especially given his history of heavy drinking this weekend.  Lab work is quite reassuring, minimally elevated lipase however doubt pancreatitis.  Had significant relief with GI cocktail.  Recommend Carafate but cost is prohibitive so he will be using Pepto-Bismol.  Return precautions discussed.  _______________________   FINAL CLINICAL IMPRESSION(S) / ED DIAGNOSES  Final diagnoses:  Acute alcoholic gastritis without hemorrhage        Note:  This document was prepared using Dragon voice recognition software and may include unintentional  dictation errors.   Jene EveryKinner, Tyqwan Pink, MD 09/10/18 2048

## 2018-09-10 NOTE — ED Triage Notes (Signed)
Patient presents to the ED with epigastric pain that got worse Saturday evening.  Patient reports history of pancreatitis.  States he last drank alcohol on Saturday.  Patient states he has had similar pain for approx. 1 week before it became severe on Saturday.  Patient states pain is epigastric and radiates into abdomen.

## 2019-05-31 ENCOUNTER — Other Ambulatory Visit: Payer: Self-pay

## 2019-05-31 ENCOUNTER — Encounter: Payer: Self-pay | Admitting: Emergency Medicine

## 2019-05-31 ENCOUNTER — Emergency Department
Admission: EM | Admit: 2019-05-31 | Discharge: 2019-05-31 | Disposition: A | Discharge status: Home / Self Care | Payer: Medicaid Other | Attending: Emergency Medicine | Admitting: Emergency Medicine

## 2019-05-31 ENCOUNTER — Emergency Department: Payer: Medicaid Other

## 2019-05-31 DIAGNOSIS — F17210 Nicotine dependence, cigarettes, uncomplicated: Secondary | ICD-10-CM | POA: Insufficient documentation

## 2019-05-31 DIAGNOSIS — K2920 Alcoholic gastritis without bleeding: Secondary | ICD-10-CM | POA: Diagnosis not present

## 2019-05-31 DIAGNOSIS — R1013 Epigastric pain: Secondary | ICD-10-CM | POA: Diagnosis present

## 2019-05-31 LAB — LIPASE, BLOOD: Lipase: 31 U/L (ref 11–51)

## 2019-05-31 LAB — CBC
HCT: 36.3 % — ABNORMAL LOW (ref 39.0–52.0)
Hemoglobin: 12.9 g/dL — ABNORMAL LOW (ref 13.0–17.0)
MCH: 33.2 pg (ref 26.0–34.0)
MCHC: 35.5 g/dL (ref 30.0–36.0)
MCV: 93.6 fL (ref 80.0–100.0)
Platelets: 225 10*3/uL (ref 150–400)
RBC: 3.88 MIL/uL — ABNORMAL LOW (ref 4.22–5.81)
RDW: 13.9 % (ref 11.5–15.5)
WBC: 6.5 10*3/uL (ref 4.0–10.5)
nRBC: 0 % (ref 0.0–0.2)

## 2019-05-31 LAB — BASIC METABOLIC PANEL
Anion gap: 11 (ref 5–15)
BUN: 7 mg/dL (ref 6–20)
CO2: 26 mmol/L (ref 22–32)
Calcium: 8.7 mg/dL — ABNORMAL LOW (ref 8.9–10.3)
Chloride: 90 mmol/L — ABNORMAL LOW (ref 98–111)
Creatinine, Ser: 0.88 mg/dL (ref 0.61–1.24)
GFR calc Af Amer: >60 mL/min (ref >60)
GFR calc non Af Amer: >60 mL/min (ref >60)
Glucose, Bld: 102 mg/dL — ABNORMAL HIGH (ref 70–99)
Potassium: 3.1 mmol/L — ABNORMAL LOW (ref 3.5–5.1)
Sodium: 127 mmol/L — ABNORMAL LOW (ref 135–145)

## 2019-05-31 LAB — TROPONIN I (HIGH SENSITIVITY): Troponin I (High Sensitivity): 3 ng/L (ref <18)

## 2019-05-31 MED ORDER — SODIUM CHLORIDE 0.9% FLUSH: 3.0000 mL | Freq: Once | INTRAVENOUS | Status: DC

## 2019-05-31 MED ORDER — PANTOPRAZOLE SODIUM 20 MG PO TBEC: 20.0000 mg | DELAYED_RELEASE_TABLET | Freq: Every day | ORAL | 1 refills | Status: DC

## 2019-05-31 MED ORDER — ONDANSETRON 4 MG PO TBDP
4.0000 mg | ORAL_TABLET | Freq: Once | ORAL | Status: AC
  Administered 2019-05-31: 16:00:00 4 mg via ORAL
  Filled 2019-05-31: qty 1

## 2019-05-31 MED ORDER — LIDOCAINE VISCOUS HCL 2 % MT SOLN
15.0000 mL | Freq: Once | OROMUCOSAL | Status: AC
  Administered 2019-05-31: 15 mL via ORAL
  Filled 2019-05-31: qty 15

## 2019-05-31 MED ORDER — ALUM & MAG HYDROXIDE-SIMETH 200-200-20 MG/5ML PO SUSP
30.0000 mL | Freq: Once | ORAL | Status: AC
  Administered 2019-05-31: 30 mL via ORAL
  Filled 2019-05-31: qty 30

## 2019-05-31 NOTE — ED Provider Notes (Signed)
[facility] Emergency Department Provider Note   ____________________________________________    I have reviewed the triage vital signs and the nursing notes.   HISTORY  Chief Complaint Chest Pain     HPI CLIFFORD BENNINGER Sr. is a 59 y.o. male with history of pancreatitis who presents with epigastric discomfort.  Patient reports this is been moderate to severe over the last 2 days.  He does admit to alcohol use.  Patient does report history of pancreatitis, he says this feels similar to pancreatitis although he notes that sometimes the pain radiates up into the center of his chest.  Denies shortness of breath.  No pleurisy.  Does not take anything for this.  No fevers chills or cough.  Review of records demonstrates that I saw the patient presenting similar back in August 2020 and he had significant provement with GI cocktail.  Past Medical History:  Diagnosis Date  . Arthritis   . Gout   . Pancreatitis     There are no problems to display for this patient.   Past Surgical History:  Procedure Laterality Date  . WRIST SURGERY      Prior to Admission medications   Medication Sig Start Date End Date Taking? Authorizing Provider  bismuth subsalicylate (PEPTO-BISMOL) 262 MG/15ML suspension Take 30 mLs by mouth 4 (four) times daily as needed. Patient not taking: Reported on 05/31/2019 09/10/18 09/10/19  Jene Every, MD  cyclobenzaprine (FLEXERIL) 5 MG tablet Take 1-2 tablets 3 times daily as needed Patient not taking: Reported on 05/31/2019 08/16/18   Enid Derry, PA-C  lidocaine (LIDODERM) 5 % Place 1 patch onto the skin every 12 (twelve) hours. Remove & Discard patch within 12 hours or as directed by MD Patient not taking: Reported on 05/31/2019 08/16/18 08/16/19  Enid Derry, PA-C  pantoprazole (PROTONIX) 20 MG tablet Take 1 tablet (20 mg total) by mouth daily. 05/31/19 05/30/20  Jene Every, MD     Allergies Patient has no known  allergies.  History reviewed. No pertinent family history.  Social History Social History   Tobacco Use  . Smoking status: Current Every Day Smoker    Packs/day: 1.00    Types: Cigarettes  . Smokeless tobacco: Never Used  Substance Use Topics  . Alcohol use: Yes    Alcohol/week: 4.0 standard drinks    Types: 4 Cans of beer per week  . Drug use: No    Review of Systems  Constitutional: No fever/chills Eyes: No visual changes.  ENT: No sore throat. Cardiovascular: Denies chest pain. Respiratory: Denies shortness of breath. Gastrointestinal: As above Genitourinary: Negative for dysuria. Musculoskeletal: Negative for back pain. Skin: Negative for rash. Neurological: Negative for headaches    ____________________________________________   PHYSICAL EXAM:  VITAL SIGNS: ED Triage Vitals  Enc Vitals Group     BP 05/31/19 1328 130/83     Pulse Rate 05/31/19 1328 87     Resp 05/31/19 1328 18     Temp 05/31/19 1427 97.7 F (36.5 C)     Temp Source 05/31/19 1427 Oral     SpO2 05/31/19 1328 97 %     Weight 05/31/19 1322 67.1 kg (148 lb)     Height 05/31/19 1322 1.778 m (5\' 10" )     Head Circumference --      Peak Flow --      Pain Score 05/31/19 1321 10     Pain Loc --      Pain Edu? --  Excl. in [place]? --     Constitutional: Alert and oriented.   Nose: No congestion/rhinnorhea. Mouth/Throat: Mucous membranes are moist.    Cardiovascular: Normal rate, regular rhythm.   Good peripheral circulation. Respiratory: Normal respiratory effort.  No retractions. Lungs CTAB. Gastrointestinal: Mild epigastric tenderness, no distention, no CVA tenderness Musculoskeletal:  Warm and well perfused Neurologic:  Normal speech and language. No gross focal neurologic deficits are appreciated.  Skin:  Skin is warm, dry and intact. No rash noted. Psychiatric: Mood and affect are normal. Speech and behavior are normal.  ____________________________________________   LABS (all  labs ordered are listed, but only abnormal results are displayed)  Labs Reviewed  BASIC METABOLIC PANEL - Abnormal; Notable for the following components:      Result Value   Sodium 127 (*)    Potassium 3.1 (*)    Chloride 90 (*)    Glucose, Bld 102 (*)    Calcium 8.7 (*)    All other components within normal limits  CBC - Abnormal; Notable for the following components:   RBC 3.88 (*)    Hemoglobin 12.9 (*)    HCT 36.3 (*)    All other components within normal limits  LIPASE, BLOOD  TROPONIN I (HIGH SENSITIVITY)   ____________________________________________  EKG  ED ECG REPORT I, Lavonia Drafts, the attending physician, personally viewed and interpreted this ECG.  Date: 05/31/2019 * Rhythm: normal sinus rhythm QRS Axis: normal Intervals: Abnormal ST/T Wave abnormalities: normal Narrative Interpretation: no evidence of acute ischemia  ____________________________________________  RADIOLOGY  Chest x-ray reviewed by me, unremarkable, no pneumonia or infiltrate ____________________________________________   PROCEDURES  Procedure(s) performed: No  Procedures   Critical Care performed: No ____________________________________________   INITIAL IMPRESSION / ASSESSMENT AND PLAN / ED COURSE  Pertinent labs & imaging results that were available during my care of the patient were reviewed by me and considered in my medical decision making (see chart for details).  Patient overall well-appearing in no acute distress complaining of epigastric discomfort.  Differential includes pancreatitis, gastritis, GERD/PUD.  Similar presentation to when I last saw the patient in August 2020.  His lab work today is reassuring, lipase is normal.  Will trial GI cocktail given that his presentation is suspicious for gastritis, likely alcohol induced.  No dark stools or bloody stools.  Patient had complete resolution of discomfort with GI cocktail.  We will start the patient on Protonix,  have strongly recommended GI follow-up.    ____________________________________________   FINAL CLINICAL IMPRESSION(S) / ED DIAGNOSES  Final diagnoses:  Acute alcoholic gastritis without hemorrhage        Note:  This document was prepared using Dragon voice recognition software and may include unintentional dictation errors.   Lavonia Drafts, MD 05/31/19 [phone]

## 2019-05-31 NOTE — ED Triage Notes (Signed)
Pt to ED with c/o of chest pain that started approx 4 days ago. Pt also states he has been unable to eat anything for a week. Pt states he has a hx of pancreatitis.

## 2019-05-31 NOTE — ED Notes (Signed)
Patient ambulated to and from room commode without difficulty. 

## 2019-06-12 ENCOUNTER — Observation Stay
Admission: EM | Admit: 2019-06-12 | Discharge: 2019-06-13 | Disposition: A | Discharge status: Home / Self Care | Payer: Medicaid Other | Attending: Internal Medicine | Admitting: Internal Medicine

## 2019-06-12 ENCOUNTER — Emergency Department: Payer: Medicaid Other

## 2019-06-12 ENCOUNTER — Other Ambulatory Visit: Payer: Self-pay

## 2019-06-12 ENCOUNTER — Encounter: Payer: Self-pay | Admitting: Emergency Medicine

## 2019-06-12 DIAGNOSIS — S2239XA Fracture of one rib, unspecified side, initial encounter for closed fracture: Secondary | ICD-10-CM | POA: Diagnosis present

## 2019-06-12 DIAGNOSIS — I2510 Atherosclerotic heart disease of native coronary artery without angina pectoris: Secondary | ICD-10-CM | POA: Diagnosis not present

## 2019-06-12 DIAGNOSIS — K859 Acute pancreatitis without necrosis or infection, unspecified: Secondary | ICD-10-CM

## 2019-06-12 DIAGNOSIS — K760 Fatty (change of) liver, not elsewhere classified: Secondary | ICD-10-CM | POA: Insufficient documentation

## 2019-06-12 DIAGNOSIS — Z720 Tobacco use: Secondary | ICD-10-CM

## 2019-06-12 DIAGNOSIS — M48061 Spinal stenosis, lumbar region without neurogenic claudication: Secondary | ICD-10-CM | POA: Diagnosis not present

## 2019-06-12 DIAGNOSIS — Z79899 Other long term (current) drug therapy: Secondary | ICD-10-CM | POA: Insufficient documentation

## 2019-06-12 DIAGNOSIS — Z20822 Contact with and (suspected) exposure to covid-19: Secondary | ICD-10-CM | POA: Diagnosis not present

## 2019-06-12 DIAGNOSIS — K2980 Duodenitis without bleeding: Secondary | ICD-10-CM | POA: Insufficient documentation

## 2019-06-12 DIAGNOSIS — R1013 Epigastric pain: Secondary | ICD-10-CM

## 2019-06-12 DIAGNOSIS — X58XXXD Exposure to other specified factors, subsequent encounter: Secondary | ICD-10-CM | POA: Diagnosis not present

## 2019-06-12 DIAGNOSIS — K2920 Alcoholic gastritis without bleeding: Secondary | ICD-10-CM | POA: Insufficient documentation

## 2019-06-12 DIAGNOSIS — M5126 Other intervertebral disc displacement, lumbar region: Secondary | ICD-10-CM | POA: Diagnosis not present

## 2019-06-12 DIAGNOSIS — M1990 Unspecified osteoarthritis, unspecified site: Secondary | ICD-10-CM | POA: Insufficient documentation

## 2019-06-12 DIAGNOSIS — F1010 Alcohol abuse, uncomplicated: Secondary | ICD-10-CM | POA: Insufficient documentation

## 2019-06-12 DIAGNOSIS — R0789 Other chest pain: Secondary | ICD-10-CM | POA: Insufficient documentation

## 2019-06-12 DIAGNOSIS — E871 Hypo-osmolality and hyponatremia: Secondary | ICD-10-CM | POA: Insufficient documentation

## 2019-06-12 DIAGNOSIS — S2241XD Multiple fractures of ribs, right side, subsequent encounter for fracture with routine healing: Secondary | ICD-10-CM

## 2019-06-12 DIAGNOSIS — I700 Atherosclerosis of aorta: Secondary | ICD-10-CM | POA: Diagnosis not present

## 2019-06-12 DIAGNOSIS — K861 Other chronic pancreatitis: Secondary | ICD-10-CM | POA: Diagnosis not present

## 2019-06-12 DIAGNOSIS — F17210 Nicotine dependence, cigarettes, uncomplicated: Secondary | ICD-10-CM | POA: Insufficient documentation

## 2019-06-12 DIAGNOSIS — K852 Alcohol induced acute pancreatitis without necrosis or infection: Secondary | ICD-10-CM | POA: Diagnosis present

## 2019-06-12 DIAGNOSIS — M47816 Spondylosis without myelopathy or radiculopathy, lumbar region: Secondary | ICD-10-CM | POA: Insufficient documentation

## 2019-06-12 DIAGNOSIS — R109 Unspecified abdominal pain: Secondary | ICD-10-CM | POA: Diagnosis present

## 2019-06-12 LAB — BASIC METABOLIC PANEL
Anion gap: 11 (ref 5–15)
BUN: 7 mg/dL — ABNORMAL LOW (ref 8–23)
CO2: 23 mmol/L (ref 22–32)
Calcium: 8.9 mg/dL (ref 8.9–10.3)
Chloride: 94 mmol/L — ABNORMAL LOW (ref 98–111)
Creatinine, Ser: 0.77 mg/dL (ref 0.61–1.24)
GFR calc Af Amer: >60 mL/min (ref >60)
GFR calc non Af Amer: >60 mL/min (ref >60)
Glucose, Bld: 238 mg/dL — ABNORMAL HIGH (ref 70–99)
Potassium: 4 mmol/L (ref 3.5–5.1)
Sodium: 128 mmol/L — ABNORMAL LOW (ref 135–145)

## 2019-06-12 LAB — CBC
HCT: 39.2 % (ref 39.0–52.0)
Hemoglobin: 13.8 g/dL (ref 13.0–17.0)
MCH: 32.9 pg (ref 26.0–34.0)
MCHC: 35.2 g/dL (ref 30.0–36.0)
MCV: 93.3 fL (ref 80.0–100.0)
Platelets: 313 10*3/uL (ref 150–400)
RBC: 4.2 MIL/uL — ABNORMAL LOW (ref 4.22–5.81)
RDW: 14 % (ref 11.5–15.5)
WBC: 9.7 10*3/uL (ref 4.0–10.5)
nRBC: 0 % (ref 0.0–0.2)

## 2019-06-12 LAB — TROPONIN I (HIGH SENSITIVITY)
Troponin I (High Sensitivity): 3 ng/L (ref <18)
Troponin I (High Sensitivity): 4 ng/L (ref <18)

## 2019-06-12 LAB — PHOSPHORUS: Phosphorus: 3.4 mg/dL (ref 2.5–4.6)

## 2019-06-12 LAB — MAGNESIUM: Magnesium: 1.7 mg/dL (ref 1.7–2.4)

## 2019-06-12 LAB — SARS CORONAVIRUS 2 BY RT PCR (HOSPITAL ORDER, PERFORMED IN ~~LOC~~ HOSPITAL LAB): SARS Coronavirus 2: NEGATIVE

## 2019-06-12 LAB — LIPASE, BLOOD: Lipase: 32 U/L (ref 11–51)

## 2019-06-12 MED ORDER — LORAZEPAM 2 MG/ML IJ SOLN: 0.0000 mg | Freq: Two times a day (BID) | INTRAMUSCULAR | Status: DC

## 2019-06-12 MED ORDER — SODIUM CHLORIDE 0.9 % IV SOLN: INTRAVENOUS | Status: DC

## 2019-06-12 MED ORDER — IOHEXOL 300 MG/ML  SOLN
100.0000 mL | Freq: Once | INTRAMUSCULAR | Status: AC | PRN
  Administered 2019-06-12: 100 mL via INTRAVENOUS

## 2019-06-12 MED ORDER — LORAZEPAM 2 MG/ML IJ SOLN
0.0000 mg | Freq: Four times a day (QID) | INTRAMUSCULAR | Status: DC
  Filled 2019-06-12: qty 1

## 2019-06-12 MED ORDER — THIAMINE HCL 100 MG PO TABS
100.0000 mg | ORAL_TABLET | Freq: Every day | ORAL | Status: DC
  Filled 2019-06-12: qty 1

## 2019-06-12 MED ORDER — ONDANSETRON HCL 4 MG/2ML IJ SOLN
4.0000 mg | Freq: Once | INTRAMUSCULAR | Status: AC
  Administered 2019-06-12: 4 mg via INTRAVENOUS
  Filled 2019-06-12: qty 2

## 2019-06-12 MED ORDER — MORPHINE SULFATE (PF) 4 MG/ML IV SOLN
4.0000 mg | Freq: Once | INTRAVENOUS | Status: AC
  Administered 2019-06-12: 4 mg via INTRAVENOUS
  Filled 2019-06-12: qty 1

## 2019-06-12 MED ORDER — OXYCODONE HCL 5 MG PO TABS
5.0000 mg | ORAL_TABLET | Freq: Four times a day (QID) | ORAL | Status: DC | PRN
  Administered 2019-06-12: 5 mg via ORAL
  Filled 2019-06-12: qty 1

## 2019-06-12 MED ORDER — LIDOCAINE VISCOUS HCL 2 % MT SOLN
15.0000 mL | Freq: Once | OROMUCOSAL | Status: AC
  Administered 2019-06-12: 15 mL via ORAL
  Filled 2019-06-12: qty 15

## 2019-06-12 MED ORDER — ALBUTEROL SULFATE (2.5 MG/3ML) 0.083% IN NEBU: 3.0000 mL | INHALATION_SOLUTION | RESPIRATORY_TRACT | Status: DC | PRN

## 2019-06-12 MED ORDER — LORAZEPAM 1 MG PO TABS: 1.0000 mg | ORAL_TABLET | ORAL | Status: DC | PRN

## 2019-06-12 MED ORDER — SODIUM CHLORIDE 0.9 % IV BOLUS
1000.0000 mL | Freq: Once | INTRAVENOUS | Status: AC
  Administered 2019-06-12: 1000 mL via INTRAVENOUS

## 2019-06-12 MED ORDER — LORAZEPAM 2 MG/ML IJ SOLN: 1.0000 mg | INTRAMUSCULAR | Status: DC | PRN

## 2019-06-12 MED ORDER — THIAMINE HCL 100 MG/ML IJ SOLN
100.0000 mg | Freq: Every day | INTRAMUSCULAR | Status: DC
  Administered 2019-06-12 – 2019-06-13 (×2): 100 mg via INTRAVENOUS
  Filled 2019-06-12 (×2): qty 2

## 2019-06-12 MED ORDER — HYDROMORPHONE HCL 1 MG/ML IJ SOLN
1.0000 mg | INTRAMUSCULAR | Status: DC | PRN
  Administered 2019-06-12 – 2019-06-13 (×2): 1 mg via INTRAVENOUS
  Filled 2019-06-12 (×2): qty 1

## 2019-06-12 MED ORDER — NICOTINE 21 MG/24HR TD PT24
21.0000 mg | MEDICATED_PATCH | Freq: Every day | TRANSDERMAL | Status: DC
  Administered 2019-06-12 – 2019-06-13 (×2): 21 mg via TRANSDERMAL
  Filled 2019-06-12 (×2): qty 1

## 2019-06-12 MED ORDER — PANTOPRAZOLE SODIUM 20 MG PO TBEC
20.0000 mg | DELAYED_RELEASE_TABLET | Freq: Every day | ORAL | Status: DC
  Administered 2019-06-13: 10:00:00 20 mg via ORAL
  Filled 2019-06-12 (×2): qty 1

## 2019-06-12 MED ORDER — MORPHINE SULFATE (PF) 4 MG/ML IV SOLN
6.0000 mg | Freq: Once | INTRAVENOUS | Status: AC
  Administered 2019-06-12: 6 mg via INTRAVENOUS
  Filled 2019-06-12: qty 2

## 2019-06-12 MED ORDER — SODIUM CHLORIDE 0.9 % IV SOLN: Freq: Once | INTRAVENOUS | Status: AC

## 2019-06-12 MED ORDER — ALUM & MAG HYDROXIDE-SIMETH 200-200-20 MG/5ML PO SUSP
30.0000 mL | Freq: Once | ORAL | Status: AC
  Administered 2019-06-12: 30 mL via ORAL
  Filled 2019-06-12: qty 30

## 2019-06-12 MED ORDER — ENOXAPARIN SODIUM 40 MG/0.4ML ~~LOC~~ SOLN
40.0000 mg | SUBCUTANEOUS | Status: DC
  Administered 2019-06-12: 18:00:00 40 mg via SUBCUTANEOUS
  Filled 2019-06-12: qty 0.4

## 2019-06-12 MED ORDER — ADULT MULTIVITAMIN W/MINERALS CH
1.0000 | ORAL_TABLET | Freq: Every day | ORAL | Status: DC
  Administered 2019-06-12 – 2019-06-13 (×2): 1 via ORAL
  Filled 2019-06-12 (×2): qty 1

## 2019-06-12 MED ORDER — ONDANSETRON HCL 4 MG/2ML IJ SOLN: 4.0000 mg | Freq: Three times a day (TID) | INTRAMUSCULAR | Status: DC | PRN

## 2019-06-12 MED ORDER — FOLIC ACID 1 MG PO TABS
1.0000 mg | ORAL_TABLET | Freq: Every day | ORAL | Status: DC
  Administered 2019-06-12 – 2019-06-13 (×2): 1 mg via ORAL
  Filled 2019-06-12 (×2): qty 1

## 2019-06-12 NOTE — ED Triage Notes (Signed)
Pt presents to ED with mid sternal chest pain that radiates into his abd. Pain has been ongoing for the past couple of weeks. Pt states he was seen in this ED for the same on 5/7 and was prescribed some medications that he did not get filled due to being expensive. Pt  unsure of what it was for nor what it was called. pt states the pain has not resovled.

## 2019-06-12 NOTE — H&P (Addendum)
History and Physical    Philip SCALISE Sr. RXV:[id number] DOB: 01/20/1961 DOA: 06/12/2019  Referring MD/NP/PA:   PCP: [facility]   Patient coming from:  The patient is coming from home.  At baseline, pt is independent for most of ADL.        Chief Complaint: Abdominal pain  HPI: Philip TANZI Sr. is a 59 y.o. male with medical history significant of tobacco abuse, alcohol abuse, pancreatitis, gout, who presents with abdominal pain.  Patient states that he has been having abdominal pain which has been going on almost 3 weeks.  It is located in the epigastric area, constant, sharp, severe, nonradiating.  It is associated with nausea and nonbilious nonbloody vomitus.  He states that he has vomited at least 5 times, and cannot eat food normally due to severe pain when he eats food.  No diarrhea.  Patient also reports right-sided chest pain, which is sharp, moderate, nonradiating.  He has mild shortness breath, but no chest pain, fever or chills.  No symptoms of UTI or unilateral weakness. Of note, pt was seen in ED on 5/7 and was diagnosed with alcoholic gastritis.  Patient was given prescription for Protonix, but the patient did not have take this occasions.  ED Course: pt was found to have lipase 32, troponin 3 --> 4, pending COVID-19 PCR, sodium 128, renal function normal, temperature normal, blood pressure 126/88, tachycardia, RR 22, oxygen saturation 100% on room air.  Chest x-ray showed healing right sixth to eighth rib fractures. CT-abd/pelvis findings are consistent with acute pancreatitis. Pt is placed on MedSurg bed for observation.   # CT-abd/pelvis showed:  1. Findings indicative of acute pancreatitis involving the head and uncinate process of the pancreas. There is edema in these areas with peripancreatic fluid and a small apparent pseudocyst between the uncinate process and third portion of duodenum. Question a degree of necrosis in a portion of the uncinate  process, characterized by decreased enhancement in this area. Mild pancreatic duct dilatation noted in the regions of the head and body of the pancreas. Body and tail of the pancreas appear essentially normal.  2. Diffuse wall thickening throughout the second and third portions of the duodenum, likely due to duodenitis secondary to nearby pancreatitis. No bowel obstruction evident. No free air. No abscess in the abdomen or pelvis evident. No periappendiceal region inflammation.  3. Thickening of the urinary bladder wall consistent with a degree of cystitis. No hydronephrosis on either side. No renal or ureteral calculus on either side.  4.  Hepatic steatosis.  5. Spinal stenosis at L3-4 and L4-5 due to disc protrusion and bony hypertrophy.  6. Aortic Atherosclerosis (ICD10-I70.0). Multifocal pelvic arterial vascular calcification noted as well as foci coronary artery calcification.  7. 5 mm nodular opacity posterior aspect of superior segment right lower lobe. No follow-up needed if patient is low-risk. Non-contrast chest CT can be considered in 12 months if patient is high-risk. This recommendation follows the consensus statement: Guidelines for Management of Incidental Pulmonary Nodules Detected on CT Images: From the Fleischner Society 2017; Radiology 2017; 284:228-243.   Review of Systems:   General: no fevers, chills, no body weight gain, has poor appetite, has fatigue HEENT: no blurry vision, hearing changes or sore throat Respiratory: has dyspnea, no coughing, wheezing CV: has chest pain, no palpitations GI: has nausea, vomiting, abdominal pain, no diarrhea, constipation GU: no dysuria, burning on urination, increased urinary frequency, hematuria  Ext: no leg edema Neuro: no unilateral  weakness, numbness, or tingling, no vision change or hearing loss Skin: no rash, no skin tear. MSK: No muscle spasm, no deformity, no limitation of range of movement in  spin Heme: No easy bruising.  Travel history: No recent long distant travel.  Allergy: No Known Allergies  Past Medical History:  Diagnosis Date  . Arthritis   . Gout   . Pancreatitis     Past Surgical History:  Procedure Laterality Date  . WRIST SURGERY      Social History:  reports that he has been smoking cigarettes. He has been smoking about 1.00 pack per day. He has never used smokeless tobacco. He reports current alcohol use of about 4.0 standard drinks of alcohol per week. He reports that he does not use drugs.  Family History: Reviewed with patient, patient states that his mother died when she was sleeping, not sure what medical problem her mother had.  Other family members are generally healthy without significant medical history.  Prior to Admission medications   Medication Sig Start Date End Date Taking? Authorizing Provider  bismuth subsalicylate (PEPTO-BISMOL) 262 MG/15ML suspension Take 30 mLs by mouth 4 (four) times daily as needed. Patient not taking: Reported on 05/31/2019 09/10/18 09/10/19  Jene Every, MD  cyclobenzaprine (FLEXERIL) 5 MG tablet Take 1-2 tablets 3 times daily as needed Patient not taking: Reported on 05/31/2019 08/16/18   Enid Derry, PA-C  lidocaine (LIDODERM) 5 % Place 1 patch onto the skin every 12 (twelve) hours. Remove & Discard patch within 12 hours or as directed by MD Patient not taking: Reported on 05/31/2019 08/16/18 08/16/19  Enid Derry, PA-C  pantoprazole (PROTONIX) 20 MG tablet Take 1 tablet (20 mg total) by mouth daily. 05/31/19 05/30/20  Jene Every, MD    Physical Exam: Vitals:   06/12/19 1045 06/12/19 1100 06/12/19 1115 06/12/19 1240  BP:    110/74  Pulse:    96  Resp: 20 (!) Temp:      TempSrc:      SpO2:    100%  Weight:      Height:       General: Not in acute distress HEENT:       Eyes: PERRL, EOMI, no scleral icterus.       ENT: No discharge from the ears and nose, no pharynx injection, no tonsillar  enlargement.        Neck: No JVD, no bruit, no mass felt. Heme: No neck lymph node enlargement. Cardiac: S1/S2, RRR, No murmurs, No gallops or rubs. Respiratory: No rales, wheezing, rhonchi or rubs. Chest wall: has right sided chest wall tenderness. GI: Soft, nondistended, has tenderness in epigastric area,  no rebound pain, no organomegaly, BS present. GU: No hematuria Ext: No pitting leg edema bilaterally. 2+DP/PT pulse bilaterally. Musculoskeletal: No joint deformities, No joint redness or warmth, no limitation of ROM in spin. Skin: No rashes.  Neuro: Alert, oriented X3, cranial nerves II-XII grossly intact, moves all extremities normally. Psych: Patient is not psychotic, no suicidal or hemocidal ideation.  Labs on Admission: I have personally reviewed following labs and imaging studies  CBC: Recent Labs  Lab 06/12/19 0639  WBC 9.7  HGB 13.8  HCT 39.2  MCV 93.3  PLT 313   Basic Metabolic Panel: Recent Labs  Lab 06/12/19 0639 06/12/19 0911  NA 128*  --   K 4.0  --   CL 94*  --   CO2 23  --   GLUCOSE 238*  --  BUN 7*  --   CREATININE 0.77  --   CALCIUM 8.9  --   MG  --  1.7  PHOS  --  3.4   GFR: Estimated Creatinine Clearance: 93.3 mL/min (by C-G formula based on SCr of 0.77 mg/dL). Liver Function Tests: No results for input(s): AST, ALT, ALKPHOS, BILITOT, PROT, ALBUMIN in the last 168 hours. Recent Labs  Lab 06/12/19 0639  LIPASE 32   No results for input(s): AMMONIA in the last 168 hours. Coagulation Profile: No results for input(s): INR, PROTIME in the last 168 hours. Cardiac Enzymes: No results for input(s): CKTOTAL, CKMB, CKMBINDEX, TROPONINI in the last 168 hours. BNP (last 3 results) No results for input(s): PROBNP in the last 8760 hours. HbA1C: No results for input(s): HGBA1C in the last 72 hours. CBG: No results for input(s): GLUCAP in the last 168 hours. Lipid Profile: No results for input(s): CHOL, HDL, LDLCALC, TRIG, CHOLHDL, LDLDIRECT in  the last 72 hours. Thyroid Function Tests: No results for input(s): TSH, T4TOTAL, FREET4, T3FREE, THYROIDAB in the last 72 hours. Anemia Panel: No results for input(s): VITAMINB12, FOLATE, FERRITIN, TIBC, IRON, RETICCTPCT in the last 72 hours. Urine analysis:    Component Value Date/Time   COLORURINE YELLOW (A) 09/10/2018 1620   APPEARANCEUR CLEAR (A) 09/10/2018 1620   APPEARANCEUR Cloudy 10/12/2011 1520   LABSPEC 1.009 09/10/2018 1620   LABSPEC 1.016 10/12/2011 1520   PHURINE 7.0 09/10/2018 1620   GLUCOSEU NEGATIVE 09/10/2018 1620   GLUCOSEU Negative 10/12/2011 1520   HGBUR NEGATIVE 09/10/2018 1620   BILIRUBINUR NEGATIVE 09/10/2018 1620   BILIRUBINUR Negative 10/12/2011 1520   KETONESUR NEGATIVE 09/10/2018 1620   PROTEINUR NEGATIVE 09/10/2018 1620   NITRITE NEGATIVE 09/10/2018 1620   LEUKOCYTESUR NEGATIVE 09/10/2018 1620   LEUKOCYTESUR 3+ 10/12/2011 1520   Sepsis Labs: @LABRCNTIP (procalcitonin:4,lacticidven:4) ) Recent Results (from the past 240 hour(s))  SARS Coronavirus 2 by RT PCR (hospital order, performed in [facility] hospital lab) Nasopharyngeal Nasopharyngeal Swab     Status: None   Collection Time: 06/12/19 11:28 AM   Specimen: Nasopharyngeal Swab  Result Value Ref Range Status   SARS Coronavirus 2 NEGATIVE NEGATIVE Final    Comment: (NOTE) SARS-CoV-2 target nucleic acids are NOT DETECTED. The SARS-CoV-2 RNA is generally detectable in upper and lower respiratory specimens during the acute phase of infection. The lowest concentration of SARS-CoV-2 viral copies this assay can detect is 250 copies / mL. A negative result does not preclude SARS-CoV-2 infection and should not be used as the sole basis for treatment or other patient management decisions.  A negative result may occur with improper specimen collection / handling, submission of specimen other than nasopharyngeal swab, presence of viral mutation(s) within the areas targeted by this assay, and inadequate  number of viral copies (<250 copies / mL). A negative result must be combined with clinical observations, patient history, and epidemiological information. Fact Sheet for Patients:   06/14/19 Fact Sheet for Healthcare Providers: BoilerBrush.com.cy This test is not yet approved or cleared  by the https://pope.com/ FDA and has been authorized for detection and/or diagnosis of SARS-CoV-2 by FDA under an Emergency Use Authorization (EUA).  This EUA will remain in effect (meaning this test can be used) for the duration of the COVID-19 declaration under Section 564(b)(1) of the Act, 21 U.S.C. section 360bbb-3(b)(1), unless the authorization is terminated or revoked sooner. Performed at [facility], [address], [place], [place]      Radiological Exams on Admission: DG  Chest 2 View  Result Date: 06/12/2019 CLINICAL DATA:  Chest pain extending to the abdomen for a couple of weeks EXAM: CHEST - 2 VIEW COMPARISON:  05/31/2019 FINDINGS: Normal heart size and mediastinal contours. No acute infiltrate or edema. No effusion or pneumothorax. Healing (non bridging callus) right sixth through eighth rib fractures. IMPRESSION: Healing right sixth to eighth rib fractures. Electronically Signed   By: Monte Fantasia M.D.   On: 06/12/2019 07:09   CT ABDOMEN PELVIS W CONTRAST  Result Date: 06/12/2019 CLINICAL DATA:  Pain with nausea and vomiting EXAM: CT ABDOMEN AND PELVIS WITH CONTRAST TECHNIQUE: Multidetector CT imaging of the abdomen and pelvis was performed using the standard protocol following bolus administration of intravenous contrast. CONTRAST:  140mL OMNIPAQUE IOHEXOL 300 MG/ML  SOLN COMPARISON:  October 12, 2011 FINDINGS: Lower chest: There is atelectatic change in the right base. There is a nodular opacity abutting the pleura in the superior segment of the right lower lobe measuring 5 x 4 mm. Lung bases otherwise are clear.  There are foci of coronary artery calcification. Hepatobiliary: There is diffuse hepatic steatosis. No focal liver lesions are appreciable. Gallbladder wall is not appreciably thickened. There is no biliary duct dilatation. Pancreas: There is extensive inflammation involving the pancreatic head and uncinate process regions. There is edema throughout this region with peripancreatic fluid. Localized pancreatic duct dilatation noted in these regions. Relative decreased enhancement of a portion of the uncinate process may indicate a degree of necrosis in this area. There is a 1 x 1 cm probable pseudo cyst located between the uncinate process and third portion of the duodenum. There is peripancreatic fluid surrounding the second and third portions of the duodenum with wall thickening in these areas. Fluid extends inferior to the pancreas, abutting the left renal vein. The body and tail of the pancreas appear essentially normal. Spleen: No splenic lesions are evident. Adrenals/Urinary Tract: Adrenals bilaterally appear normal. There is a cyst in the mid left kidney measuring 1.6 x 1.6 cm. There is a 4 mm cyst in the lower pole of the right kidney. There is no appreciable hydronephrosis on either side. There is no evident renal or ureteral calculus on either side. Urinary bladder is midline. Urinary bladder wall is mildly thickened. Stomach/Bowel: Wall thickening is noted throughout the second and third portions of the duodenum, likely due to adjacent peripancreatic fluid. No wall thickening of bowel noted elsewhere. No demonstrable bowel obstruction. The terminal ileum appears unremarkable. There is apparent postoperative change in this area with anastomosis patent. No free air or portal venous air evident. Vascular/Lymphatic: No abdominal aortic aneurysm. There is aorta and common iliac artery atherosclerotic calcification. Calcification is also noted in the internal and external iliac arteries and common femoral  arteries. Major venous structures appear patent. No adenopathy is appreciable in the abdomen or pelvis. Reproductive: Prostate and seminal vesicles are normal in size and contour. No pelvic mass evident. Other: No periappendiceal region inflammation. No abscess identified in the abdomen pelvis. No ascitic type fluid evident. There is scarring with postoperative change in the inguinal regions, more notable on the left. No hernia evident in the inguinal regions. Musculoskeletal: Degenerative changes noted in the lumbar spine with vacuum phenomenon at L4-5 and L5-S1. There is a degree of spinal stenosis at L3-4 and L4-5 due to disc protrusion and bony hypertrophy. No blastic or lytic bone lesions are evident. No intramuscular lesions evident. IMPRESSION: 1. Findings indicative of acute pancreatitis involving the head and uncinate process of the  pancreas. There is edema in these areas with peripancreatic fluid and a small apparent pseudocyst between the uncinate process and third portion of duodenum. Question a degree of necrosis in a portion of the uncinate process, characterized by decreased enhancement in this area. Mild pancreatic duct dilatation noted in the regions of the head and body of the pancreas. Body and tail of the pancreas appear essentially normal. 2. Diffuse wall thickening throughout the second and third portions of the duodenum, likely due to duodenitis secondary to nearby pancreatitis. No bowel obstruction evident. No free air. No abscess in the abdomen or pelvis evident. No periappendiceal region inflammation. 3. Thickening of the urinary bladder wall consistent with a degree of cystitis. No hydronephrosis on either side. No renal or ureteral calculus on either side. 4.  Hepatic steatosis. 5. Spinal stenosis at L3-4 and L4-5 due to disc protrusion and bony hypertrophy. 6. Aortic Atherosclerosis (ICD10-I70.0). Multifocal pelvic arterial vascular calcification noted as well as foci coronary artery  calcification. 7. 5 mm nodular opacity posterior aspect of superior segment right lower lobe. No follow-up needed if patient is low-risk. Non-contrast chest CT can be considered in 12 months if patient is high-risk. This recommendation follows the consensus statement: Guidelines for Management of Incidental Pulmonary Nodules Detected on CT Images: From the Fleischner Society 2017; Radiology 2017; 284:228-243. Electronically Signed   By: Bretta BangWilliam  Woodruff III M.D.   On: 06/12/2019 10:55     EKG: Independently reviewed.  Sinus rhythm, QTC 475,nonspecific T wave change.  Assessment/Plan Principal Problem:   Pancreatitis, recurrent Active Problems:   Abdominal pain   Hyponatremia   Closed rib fracture   Chest wall pain   Tobacco abuse   Alcohol abuse   Pancreatitis, recurrent: His lipase is not elevated at 32, but CT scan findings are indicative of acute pancreatitis involving the head and uncinate process of the pancreas. CT showed edema in these areas with peripancreatic fluid and a small apparent pseudocyst between the uncinate process and third portion of duodenum. Question a degree of necrosis in a portion of the uncinate process, characterized by decreased enhancement in this area. Mild pancreatic duct dilatation noted in the regions of the head and body of the pancreas. Body and tail of the pancreas appear essentially normal.  His pancreatitis is most likely due to continuation of alcohol abuse.  -will place on med-surg bed for bos -NPO for pancreatitis -IVF: 1LNS and then at 125 cc/hr -prn IV dilauddi and prn oxycodone for pain control -prn IV zofran for nausea -Patient is educated for the importance of quitting alcohol use.   Abdominal pain: Patient has epigastric abdominal pain, likely multifactorial etiology.  Including alcoholic gastritis and possible duodenitis. -Pain control as above -Started Protonix  Tobacco abuse and Alcohol abuse: -Did counseling about importance of  quitting smoking -Nicotine patch -Did counseling about the importance of quitting drinking -CIWA protocol  Hyponatremia: Na 128.  Mental status normal, most likely due to nausea, vomiting with decreased oral intake -Patient is on IV normal saline as above  Closed rib fracture: pt has right-sided chest pain and also has chest wall tenderness.  It is most likely due to rib fracture as shown by CXR. Trop 3 -->4. -Pain control as above -Incentive spirometry  Lung Nodules: CT-abd/pelvis inccidently showed 5 mm nodular opacity posterior aspect of superior segment right lower lobe. -f/u with PCP     DVT ppx: SQ Lovenox Code Status: Full code Family Communication: not done, no family member is at bed  side.  Disposition Plan:  Anticipate discharge back to previous home environment Consults called:  none Admission status: Med-surg bed for obs    Status is: Observation  The patient remains OBS appropriate and will d/c before 2 midnights.  Dispo: The patient is from: Home              Anticipated d/c is to: Home              Anticipated d/c date is: 1 day              Patient currently is not medically stable to d/c.       Date of Service 06/12/2019    Lorretta Harp [facility]   If 7PM-7AM, please contact night-coverage [email or web address] 06/12/2019, 1:34 PM

## 2019-06-12 NOTE — ED Notes (Signed)
Pt resting comfortably at this time.

## 2019-06-12 NOTE — ED Provider Notes (Addendum)
[facility] Emergency Department Provider Note  ____________________________________________   First MD Initiated Contact with Patient 06/12/19 0940     (approximate)  I have reviewed the triage vital signs and the nursing notes.   HISTORY  Chief Complaint Chest Pain and Abdominal Pain    HPI Philip STARNES Sr. is a 59 y.o. male with history of recurrent pancreatitis here with abdominal pain.  The patient was just seen and treated for alcoholic gastritis on 5/7.  He states that he did not fill his medication because it was too expensive.  He states that over the last several days, he has had progressively worsening aching, gnawing, epigastric abdominal pain with associated nausea and vomiting.  He said difficulty eating and drinking.  He states that he has had similar symptoms in the past, and was admitted at [facility] with pancreatitis.  He does endorse fairly regular alcohol use although he does not recall increasing the use prior to the onset of pain.  He has not had any fevers or chills.  No diarrhea.  No constipation.  No recent sick contacts.  No suspicious food intake.  No recent travel.        Past Medical History:  Diagnosis Date  . Arthritis   . Gout   . Pancreatitis     There are no problems to display for this patient.   Past Surgical History:  Procedure Laterality Date  . WRIST SURGERY      Prior to Admission medications   Medication Sig Start Date End Date Taking? Authorizing Provider  bismuth subsalicylate (PEPTO-BISMOL) 262 MG/15ML suspension Take 30 mLs by mouth 4 (four) times daily as needed. Patient not taking: Reported on 05/31/2019 09/10/18 09/10/19  Jene Every, MD  cyclobenzaprine (FLEXERIL) 5 MG tablet Take 1-2 tablets 3 times daily as needed Patient not taking: Reported on 05/31/2019 08/16/18   Enid Derry, PA-C  lidocaine (LIDODERM) 5 % Place 1 patch onto the skin every 12 (twelve) hours. Remove & Discard patch within 12 hours  or as directed by MD Patient not taking: Reported on 05/31/2019 08/16/18 08/16/19  Enid Derry, PA-C  pantoprazole (PROTONIX) 20 MG tablet Take 1 tablet (20 mg total) by mouth daily. 05/31/19 05/30/20  Jene Every, MD    Allergies Patient has no known allergies.  No family history on file.  Social History Social History   Tobacco Use  . Smoking status: Current Every Day Smoker    Packs/day: 1.00    Types: Cigarettes  . Smokeless tobacco: Never Used  Substance Use Topics  . Alcohol use: Yes    Alcohol/week: 4.0 standard drinks    Types: 4 Cans of beer per week  . Drug use: No    Review of Systems  Review of Systems  Constitutional: Positive for fatigue. Negative for chills and fever.  HENT: Negative for sore throat.   Respiratory: Negative for shortness of breath.   Cardiovascular: Negative for chest pain.  Gastrointestinal: Positive for abdominal pain, nausea and vomiting.  Genitourinary: Negative for flank pain.  Musculoskeletal: Negative for neck pain.  Skin: Negative for rash and wound.  Allergic/Immunologic: Negative for immunocompromised state.  Neurological: Negative for weakness and numbness.  Hematological: Does not bruise/bleed easily.  All other systems reviewed and are negative.    ____________________________________________  PHYSICAL EXAM:      VITAL SIGNS: ED Triage Vitals  Enc Vitals Group     BP 06/12/19 0731 (!) 121/92     Pulse Rate 06/12/19 0731  99     Resp 06/12/19 0731 16     Temp 06/12/19 0731 98 F (36.7 C)     Temp Source 06/12/19 0731 Oral     SpO2 06/12/19 0731 100 %     Weight 06/12/19 0636 150 lb (68 kg)     Height 06/12/19 0636 5\' 10"  (1.778 m)     Head Circumference --      Peak Flow --      Pain Score 06/12/19 0635 9     Pain Loc --      Pain Edu? --      Excl. in GC? --      Physical Exam Vitals and nursing note reviewed.  Constitutional:      General: He is not in acute distress.    Appearance: He is well-developed.   HENT:     Head: Normocephalic and atraumatic.  Eyes:     Conjunctiva/sclera: Conjunctivae normal.  Cardiovascular:     Rate and Rhythm: Normal rate and regular rhythm.     Heart sounds: Normal heart sounds. No murmur. No friction rub.  Pulmonary:     Effort: Pulmonary effort is normal. No respiratory distress.     Breath sounds: Normal breath sounds. No wheezing or rales.  Abdominal:     General: There is no distension.     Palpations: Abdomen is soft.     Tenderness: There is abdominal tenderness.     Comments: Moderate epigastric tenderness, no rebound or guarding  Musculoskeletal:     Cervical back: Neck supple.  Skin:    General: Skin is warm.     Capillary Refill: Capillary refill takes less than 2 seconds.  Neurological:     Mental Status: He is alert and oriented to person, place, and time.     Motor: No abnormal muscle tone.       ____________________________________________   LABS (all labs ordered are listed, but only abnormal results are displayed)  Labs Reviewed  BASIC METABOLIC PANEL - Abnormal; Notable for the following components:      Result Value   Sodium 128 (*)    Chloride 94 (*)    Glucose, Bld 238 (*)    BUN 7 (*)    All other components within normal limits  CBC - Abnormal; Notable for the following components:   RBC 4.20 (*)    All other components within normal limits  SARS CORONAVIRUS 2 BY RT PCR (HOSPITAL ORDER, PERFORMED IN [facility])  LIPASE, BLOOD  TROPONIN I (HIGH SENSITIVITY)  TROPONIN I (HIGH SENSITIVITY)    ____________________________________________  EKG: Sinus tachycardia, ventricular rate 107.  PR 138, QRS 62, QTc 456.  Nonspecific ST changes.  No acute ST elevations or depressions. ________________________________________  RADIOLOGY All imaging, including plain films, CT scans, and ultrasounds, independently reviewed by me, and interpretations confirmed via formal radiology reads.  ED MD interpretation:    Described: Clear, healing right sixth through eighth rib fractures  Official radiology report(s): DG Chest 2 View  Result Date: 06/12/2019 CLINICAL DATA:  Chest pain extending to the abdomen for a couple of weeks EXAM: CHEST - 2 VIEW COMPARISON:  05/31/2019 FINDINGS: Normal heart size and mediastinal contours. No acute infiltrate or edema. No effusion or pneumothorax. Healing (non bridging callus) right sixth through eighth rib fractures. IMPRESSION: Healing right sixth to eighth rib fractures. Electronically Signed   By: 07/31/2019 M.D.   On: 06/12/2019 07:09   CT ABDOMEN PELVIS W CONTRAST  Result Date: 06/12/2019 CLINICAL DATA:  Pain with nausea and vomiting EXAM: CT ABDOMEN AND PELVIS WITH CONTRAST TECHNIQUE: Multidetector CT imaging of the abdomen and pelvis was performed using the standard protocol following bolus administration of intravenous contrast. CONTRAST:  OMNIPAQUE IOHEXOL 300 MG/ML  SOLN COMPARISON:  October 12, 2011 FINDINGS: Lower chest: There is atelectatic change in the right base. There is a nodular opacity abutting the pleura in the superior segment of the right lower lobe measuring 5 x 4 mm. Lung bases otherwise are clear. There are foci of coronary artery calcification. Hepatobiliary: There is diffuse hepatic steatosis. No focal liver lesions are appreciable. Gallbladder wall is not appreciably thickened. There is no biliary duct dilatation. Pancreas: There is extensive inflammation involving the pancreatic head and uncinate process regions. There is edema throughout this region with peripancreatic fluid. Localized pancreatic duct dilatation noted in these regions. Relative decreased enhancement of a portion of the uncinate process may indicate a degree of necrosis in this area. There is a 1 x 1 cm probable pseudo cyst located between the uncinate process and third portion of the duodenum. There is peripancreatic fluid surrounding the second and third portions of the  duodenum with wall thickening in these areas. Fluid extends inferior to the pancreas, abutting the left renal vein. The body and tail of the pancreas appear essentially normal. Spleen: No splenic lesions are evident. Adrenals/Urinary Tract: Adrenals bilaterally appear normal. There is a cyst in the mid left kidney measuring 1.6 x 1.6 cm. There is a 4 mm cyst in the lower pole of the right kidney. There is no appreciable hydronephrosis on either side. There is no evident renal or ureteral calculus on either side. Urinary bladder is midline. Urinary bladder wall is mildly thickened. Stomach/Bowel: Wall thickening is noted throughout the second and third portions of the duodenum, likely due to adjacent peripancreatic fluid. No wall thickening of bowel noted elsewhere. No demonstrable bowel obstruction. The terminal ileum appears unremarkable. There is apparent postoperative change in this area with anastomosis patent. No free air or portal venous air evident. Vascular/Lymphatic: No abdominal aortic aneurysm. There is aorta and common iliac artery atherosclerotic calcification. Calcification is also noted in the internal and external iliac arteries and common femoral arteries. Major venous structures appear patent. No adenopathy is appreciable in the abdomen or pelvis. Reproductive: Prostate and seminal vesicles are normal in size and contour. No pelvic mass evident. Other: No periappendiceal region inflammation. No abscess identified in the abdomen pelvis. No ascitic type fluid evident. There is scarring with postoperative change in the inguinal regions, more notable on the left. No hernia evident in the inguinal regions. Musculoskeletal: Degenerative changes noted in the lumbar spine with vacuum phenomenon at L4-5 and L5-S1. There is a degree of spinal stenosis at L3-4 and L4-5 due to disc protrusion and bony hypertrophy. No blastic or lytic bone lesions are evident. No intramuscular lesions evident. IMPRESSION: 1.  Findings indicative of acute pancreatitis involving the head and uncinate process of the pancreas. There is edema in these areas with peripancreatic fluid and a small apparent pseudocyst between the uncinate process and third portion of duodenum. Question a degree of necrosis in a portion of the uncinate process, characterized by decreased enhancement in this area. Mild pancreatic duct dilatation noted in the regions of the head and body of the pancreas. Body and tail of the pancreas appear essentially normal. 2. Diffuse wall thickening throughout the second and third portions of the duodenum, likely due to duodenitis  secondary to nearby pancreatitis. No bowel obstruction evident. No free air. No abscess in the abdomen or pelvis evident. No periappendiceal region inflammation. 3. Thickening of the urinary bladder wall consistent with a degree of cystitis. No hydronephrosis on either side. No renal or ureteral calculus on either side. 4.  Hepatic steatosis. 5. Spinal stenosis at L3-4 and L4-5 due to disc protrusion and bony hypertrophy. 6. Aortic Atherosclerosis (ICD10-I70.0). Multifocal pelvic arterial vascular calcification noted as well as foci coronary artery calcification. 7. 5 mm nodular opacity posterior aspect of superior segment right lower lobe. No follow-up needed if patient is low-risk. Non-contrast chest CT can be considered in 12 months if patient is high-risk. This recommendation follows the consensus statement: Guidelines for Management of Incidental Pulmonary Nodules Detected on CT Images: From the Fleischner Society 2017; Radiology 2017; 284:228-243. Electronically Signed   By: Bretta BangWilliam  Woodruff III M.D.   On: 06/12/2019 10:55    ____________________________________________  PROCEDURES   Procedure(s) performed (including Critical Care):  .1-3 Lead EKG Interpretation Performed by: Shaune PollackIsaacs, Mikesha Migliaccio, MD Authorized by: Shaune PollackIsaacs, Tejas Seawood, MD     Interpretation: non-specific     ECG rate:   90-110   ECG rate assessment: tachycardic     Rhythm: sinus rhythm     Ectopy: none     Conduction: normal   Comments:     Indication: Chest/upper abd pain, receiving pain medications    ____________________________________________  INITIAL IMPRESSION / MDM / ASSESSMENT AND PLAN / ED COURSE  As part of my medical decision making, I reviewed the following data within the electronic MEDICAL RECORD NUMBER Nursing notes reviewed and incorporated, Old chart reviewed, Notes from prior ED visits, and [place] Controlled Substance Database       *Philip EagleBarry W Crossen Sr. was evaluated in Emergency Department on 06/12/2019 for the symptoms described in the history of present illness. He was evaluated in the context of the global COVID-19 pandemic, which necessitated consideration that the patient might be at risk for infection with the SARS-CoV-2 virus that causes COVID-19. Institutional protocols and algorithms that pertain to the evaluation of patients at risk for COVID-19 are in a state of rapid change based on information released by regulatory bodies including the CDC and federal and state organizations. These policies and algorithms were followed during the patient's care in the ED.  Some ED evaluations and interventions may be delayed as a result of limited staffing during the pandemic.*     Medical Decision Making: 59 year old male here with recurrent, ongoing upper abdominal pain.  Patient was just seen and discharged for similar symptoms.  He appears moderately dehydrated.  Given his persistent symptoms, CT scan obtained and shows acute on chronic pancreatitis.  His lipase is normal, and I suspect this is likely due to component of underlying chronic pancreatitis from alcohol abuse.  Will start IV fluids and admit given his ongoing pain, nausea, and vomiting.  No signs of abscess or acute infection or hemorrhage.  Is a very small pseudocyst but no evidence of  obstruction.  ____________________________________________  FINAL CLINICAL IMPRESSION(S) / ED DIAGNOSES  Final diagnoses:  Alcohol-induced acute pancreatitis without infection or necrosis  Duodenitis     MEDICATIONS GIVEN DURING THIS VISIT:  Medications  morphine 4 MG/ML injection 4 mg (has no administration in time range)  0.9 %  sodium chloride infusion (has no administration in time range)  morphine 4 MG/ML injection 6 mg (6 mg Intravenous Given 06/12/19 1011)  ondansetron (ZOFRAN) injection 4 mg (4 mg Intravenous  Given 06/12/19 1011)  sodium chloride 0.9 % bolus 1,000 mL (1,000 mLs Intravenous New Bag/Given 06/12/19 1015)  alum & mag hydroxide-simeth (MAALOX/MYLANTA) 200-200-20 MG/5ML suspension 30 mL (30 mLs Oral Given 06/12/19 1014)    And  lidocaine (XYLOCAINE) 2 % viscous mouth solution 15 mL (15 mLs Oral Given 06/12/19 1014)  iohexol (OMNIPAQUE) 300 MG/ML solution 100 mL (100 mLs Intravenous Contrast Given 06/12/19 1029)     ED Discharge Orders    None       Note:  This document was prepared using Dragon voice recognition software and may include unintentional dictation errors.   Duffy Bruce, MD 06/12/19 1120    Duffy Bruce, MD 06/12/19 1120

## 2019-06-13 LAB — COMPREHENSIVE METABOLIC PANEL
ALT: 29 U/L (ref 0–44)
AST: 54 U/L — ABNORMAL HIGH (ref 15–41)
Albumin: 2.3 g/dL — ABNORMAL LOW (ref 3.5–5.0)
Alkaline Phosphatase: 85 U/L (ref 38–126)
Anion gap: 8 (ref 5–15)
BUN: 8 mg/dL (ref 8–23)
CO2: 21 mmol/L — ABNORMAL LOW (ref 22–32)
Calcium: 7.9 mg/dL — ABNORMAL LOW (ref 8.9–10.3)
Chloride: 103 mmol/L (ref 98–111)
Creatinine, Ser: 0.84 mg/dL (ref 0.61–1.24)
GFR calc Af Amer: >60 mL/min (ref >60)
GFR calc non Af Amer: >60 mL/min (ref >60)
Glucose, Bld: 66 mg/dL — ABNORMAL LOW (ref 70–99)
Potassium: 4.4 mmol/L (ref 3.5–5.1)
Sodium: 132 mmol/L — ABNORMAL LOW (ref 135–145)
Total Bilirubin: 0.6 mg/dL (ref 0.3–1.2)
Total Protein: 5.9 g/dL — ABNORMAL LOW (ref 6.5–8.1)

## 2019-06-13 LAB — CBC
HCT: 30.3 % — ABNORMAL LOW (ref 39.0–52.0)
Hemoglobin: 10.6 g/dL — ABNORMAL LOW (ref 13.0–17.0)
MCH: 33.2 pg (ref 26.0–34.0)
MCHC: 35 g/dL (ref 30.0–36.0)
MCV: 95 fL (ref 80.0–100.0)
Platelets: 239 10*3/uL (ref 150–400)
RBC: 3.19 MIL/uL — ABNORMAL LOW (ref 4.22–5.81)
RDW: 14.4 % (ref 11.5–15.5)
WBC: 8 10*3/uL (ref 4.0–10.5)
nRBC: 0 % (ref 0.0–0.2)

## 2019-06-13 LAB — GLUCOSE, CAPILLARY
Glucose-Capillary: 53 mg/dL — ABNORMAL LOW (ref 70–99)
Glucose-Capillary: 132 mg/dL — ABNORMAL HIGH (ref 70–99)

## 2019-06-13 LAB — HIV ANTIBODY (ROUTINE TESTING W REFLEX): HIV Screen 4th Generation wRfx: NONREACTIVE

## 2019-06-13 MED ORDER — ADULT MULTIVITAMIN W/MINERALS CH: 1.0000 | ORAL_TABLET | Freq: Every day | ORAL | 1 refills | Status: AC

## 2019-06-13 MED ORDER — PANTOPRAZOLE SODIUM 40 MG PO TBEC
40.0000 mg | DELAYED_RELEASE_TABLET | Freq: Every day | ORAL | 1 refills | Status: DC
Start: 2019-06-13 — End: 2019-08-09

## 2019-06-13 MED ORDER — OXYCODONE HCL 5 MG PO TABS: 5.0000 mg | ORAL_TABLET | Freq: Four times a day (QID) | ORAL | 0 refills | Status: DC | PRN

## 2019-06-13 NOTE — Discharge Summary (Signed)
Physician Discharge Summary  Philip Eagle Sr. QQP:[id number] DOB: 09/26/1960 DOA: 06/12/2019  PCP: [facility]  Admit date: 06/12/2019 Discharge date: 06/13/2019  Admitted From: Home Disposition:  Home  Recommendations for Outpatient Follow-up:  1. Follow up with PCP in 1-2 weeks 2. Please obtain BMP/CBC in one week 3. Please follow up on the following pending results:None  Home Health: No Equipment/Devices: None Discharge Condition: Stable CODE STATUS: Full Diet recommendation: Heart Healthy   Brief/Interim Summary:  Philip RUBEN Sr. is a 59 y.o. male with medical history significant of tobacco abuse, alcohol abuse, pancreatitis, gout, who presents with abdominal pain.  Patient states that he has been having abdominal pain which has been going on almost 3 weeks.  It is located in the epigastric area, constant, sharp, severe, nonradiating.  It is associated with nausea and nonbilious nonbloody vomitus. He was found to have lipase of 32, sodium of 128, normal renal function.Chest x-ray showed healing right sixth to eighth rib fractures. CT-abd/pelvis findings are consistent with acute pancreatitis.  Next morning patient was feeling better, abdominal pain improved, no more nausea or vomiting. He was initially started on clear liquids and able to tolerate advanced diet by discharge. He was counseled extensively about quitting alcohol. He was also asked to follow-up with primary care physician to use the resources to help quitting. He was provided with some oxycodone for pain to be used as needed.  CT-abd/pelvis inccidently showed 5 mm nodular opacity posterior aspect of superior segment right lower lobe, which needs to be followed up with PCP.  Discharge Diagnoses:  Principal Problem:   Pancreatitis, recurrent Active Problems:   Abdominal pain   Hyponatremia   Closed rib fracture   Chest wall pain   Tobacco abuse   Alcohol abuse  Discharge  Instructions  Discharge Instructions    Diet - low sodium heart healthy   Complete by: As directed    Discharge instructions   Complete by: As directed    It was pleasure taking care of you. Continue taking soft diet for another day and then slowly advance it. Please keep yourself well-hydrated. You are being given some pain medicine which you can take it as needed. I also increased the dose of Protonix to 40 mg daily, please take it as directed as it will help with your gastritis. It is very important that you stop drinking, if you need any help please contact your primary care physician as they have resources to help you quitting.   Increase activity slowly   Complete by: As directed      Allergies as of 06/13/2019   No Known Allergies     Medication List    TAKE these medications   multivitamin with minerals Tabs tablet Take 1 tablet by mouth daily. Start taking on: Jun 14, 2019   oxyCODONE 5 MG immediate release tablet Commonly known as: Oxy IR/ROXICODONE Take 1 tablet (5 mg total) by mouth every 6 (six) hours as needed for moderate pain or severe pain.   pantoprazole 40 MG tablet Commonly known as: Protonix Take 1 tablet (40 mg total) by mouth daily. What changed:   medication strength  how much to take      Follow-up Information    [facility]   Specialty: General Practice Contact information: [address] [place] [address] [phone]          No Known Allergies  Consultations:  None  Procedures/Studies: DG Chest 2  View  Result Date: 06/12/2019 CLINICAL DATA:  Chest pain extending to the abdomen for a couple of weeks EXAM: CHEST - 2 VIEW COMPARISON:  05/31/2019 FINDINGS: Normal heart size and mediastinal contours. No acute infiltrate or edema. No effusion or pneumothorax. Healing (non bridging callus) right sixth through eighth rib fractures. IMPRESSION: Healing right sixth to eighth rib fractures. Electronically Signed    By: Marnee SpringJonathon  Watts M.D.   On: 06/12/2019 07:09   DG Chest 2 View  Result Date: 05/31/2019 CLINICAL DATA:  Chest pain for 4 days. EXAM: CHEST - 2 VIEW COMPARISON:  PA and lateral chest 09/10/2018 and 12/14/2007. FINDINGS: Lungs clear. Heart size normal. No pneumothorax or pleural fluid. Atherosclerosis noted. No acute or focal bony abnormality. IMPRESSION: No acute disease. Aortic Atherosclerosis (ICD10-I70.0). Electronically Signed   By: Drusilla Kannerhomas  Dalessio M.D.   On: 05/31/2019 13:59   CT ABDOMEN PELVIS W CONTRAST  Result Date: 06/12/2019 CLINICAL DATA:  Pain with nausea and vomiting EXAM: CT ABDOMEN AND PELVIS WITH CONTRAST TECHNIQUE: Multidetector CT imaging of the abdomen and pelvis was performed using the standard protocol following bolus administration of intravenous contrast. CONTRAST:  100mL OMNIPAQUE IOHEXOL 300 MG/ML  SOLN COMPARISON:  October 12, 2011 FINDINGS: Lower chest: There is atelectatic change in the right base. There is a nodular opacity abutting the pleura in the superior segment of the right lower lobe measuring 5 x 4 mm. Lung bases otherwise are clear. There are foci of coronary artery calcification. Hepatobiliary: There is diffuse hepatic steatosis. No focal liver lesions are appreciable. Gallbladder wall is not appreciably thickened. There is no biliary duct dilatation. Pancreas: There is extensive inflammation involving the pancreatic head and uncinate process regions. There is edema throughout this region with peripancreatic fluid. Localized pancreatic duct dilatation noted in these regions. Relative decreased enhancement of a portion of the uncinate process may indicate a degree of necrosis in this area. There is a 1 x 1 cm probable pseudo cyst located between the uncinate process and third portion of the duodenum. There is peripancreatic fluid surrounding the second and third portions of the duodenum with wall thickening in these areas. Fluid extends inferior to the pancreas,  abutting the left renal vein. The body and tail of the pancreas appear essentially normal. Spleen: No splenic lesions are evident. Adrenals/Urinary Tract: Adrenals bilaterally appear normal. There is a cyst in the mid left kidney measuring 1.6 x 1.6 cm. There is a 4 mm cyst in the lower pole of the right kidney. There is no appreciable hydronephrosis on either side. There is no evident renal or ureteral calculus on either side. Urinary bladder is midline. Urinary bladder wall is mildly thickened. Stomach/Bowel: Wall thickening is noted throughout the second and third portions of the duodenum, likely due to adjacent peripancreatic fluid. No wall thickening of bowel noted elsewhere. No demonstrable bowel obstruction. The terminal ileum appears unremarkable. There is apparent postoperative change in this area with anastomosis patent. No free air or portal venous air evident. Vascular/Lymphatic: No abdominal aortic aneurysm. There is aorta and common iliac artery atherosclerotic calcification. Calcification is also noted in the internal and external iliac arteries and common femoral arteries. Major venous structures appear patent. No adenopathy is appreciable in the abdomen or pelvis. Reproductive: Prostate and seminal vesicles are normal in size and contour. No pelvic mass evident. Other: No periappendiceal region inflammation. No abscess identified in the abdomen pelvis. No ascitic type fluid evident. There is scarring with postoperative change in the inguinal regions, more notable  on the left. No hernia evident in the inguinal regions. Musculoskeletal: Degenerative changes noted in the lumbar spine with vacuum phenomenon at L4-5 and L5-S1. There is a degree of spinal stenosis at L3-4 and L4-5 due to disc protrusion and bony hypertrophy. No blastic or lytic bone lesions are evident. No intramuscular lesions evident. IMPRESSION: 1. Findings indicative of acute pancreatitis involving the head and uncinate process of the  pancreas. There is edema in these areas with peripancreatic fluid and a small apparent pseudocyst between the uncinate process and third portion of duodenum. Question a degree of necrosis in a portion of the uncinate process, characterized by decreased enhancement in this area. Mild pancreatic duct dilatation noted in the regions of the head and body of the pancreas. Body and tail of the pancreas appear essentially normal. 2. Diffuse wall thickening throughout the second and third portions of the duodenum, likely due to duodenitis secondary to nearby pancreatitis. No bowel obstruction evident. No free air. No abscess in the abdomen or pelvis evident. No periappendiceal region inflammation. 3. Thickening of the urinary bladder wall consistent with a degree of cystitis. No hydronephrosis on either side. No renal or ureteral calculus on either side. 4.  Hepatic steatosis. 5. Spinal stenosis at L3-4 and L4-5 due to disc protrusion and bony hypertrophy. 6. Aortic Atherosclerosis (ICD10-I70.0). Multifocal pelvic arterial vascular calcification noted as well as foci coronary artery calcification. 7. 5 mm nodular opacity posterior aspect of superior segment right lower lobe. No follow-up needed if patient is low-risk. Non-contrast chest CT can be considered in 12 months if patient is high-risk. This recommendation follows the consensus statement: Guidelines for Management of Incidental Pulmonary Nodules Detected on CT Images: From the Fleischner Society 2017; Radiology 2017; 284:228-243. Electronically Signed   By: Bretta Bang III M.D.   On: 06/12/2019 10:55     Subjective: Was feeling better when seen today. He was able to tolerate clear liquids and was open to try advancing diet. We counseled him against alcohol use.  Discharge Exam: Vitals:   06/12/19 2352 06/13/19 0900  BP: 119/90 138/72  Pulse: 89   Resp: (!) 21 20  Temp: 97.6 F (36.4 C) 98.2 F (36.8 C)  SpO2: 99%    Vitals:   06/12/19 1645  06/12/19 1715 06/12/19 2352 06/13/19 0900  BP:  112/80 119/90 138/72  Pulse: 78 81 89   Resp: 17 17 (!) 21 20  Temp:  98.2 F (36.8 C) 97.6 F (36.4 C) 98.2 F (36.8 C)  TempSrc:  Oral Oral Oral  SpO2: 96% 96% 99%   Weight:      Height:        General: Pt is alert, awake, not in acute distress Cardiovascular: RRR, S1/S2 +, no rubs, no gallops Respiratory: CTA bilaterally, no wheezing, no rhonchi Abdominal: Soft, NT, ND, bowel sounds + Extremities: no edema, no cyanosis   The results of significant diagnostics from this hospitalization (including imaging, microbiology, ancillary and laboratory) are listed below for reference.    Microbiology: Recent Results (from the past 240 hour(s))  SARS Coronavirus 2 by RT PCR (hospital order, performed in [facility] hospital lab) Nasopharyngeal Nasopharyngeal Swab     Status: None   Collection Time: 06/12/19 11:28 AM   Specimen: Nasopharyngeal Swab  Result Value Ref Range Status   SARS Coronavirus 2 NEGATIVE NEGATIVE Final    Comment: (NOTE) SARS-CoV-2 target nucleic acids are NOT DETECTED. The SARS-CoV-2 RNA is generally detectable in upper and lower respiratory specimens during the  acute phase of infection. The lowest concentration of SARS-CoV-2 viral copies this assay can detect is 250 copies / mL. A negative result does not preclude SARS-CoV-2 infection and should not be used as the sole basis for treatment or other patient management decisions.  A negative result may occur with improper specimen collection / handling, submission of specimen other than nasopharyngeal swab, presence of viral mutation(s) within the areas targeted by this assay, and inadequate number of viral copies (<250 copies / mL). A negative result must be combined with clinical observations, patient history, and epidemiological information. Fact Sheet for Patients:   StrictlyIdeas.no Fact Sheet for Healthcare  Providers: BankingDealers.co.za This test is not yet approved or cleared  by the [place] FDA and has been authorized for detection and/or diagnosis of SARS-CoV-2 by FDA under an Emergency Use Authorization (EUA).  This EUA will remain in effect (meaning this test can be used) for the duration of the COVID-19 declaration under Section 564(b)(1) of the Act, 21 U.S.C. section 360bbb-3(b)(1), unless the authorization is terminated or revoked sooner. Performed at [facility], [address] [place], [place] [address]      Labs: BNP (last 3 results) No results for input(s): BNP in the last 8760 hours. Basic Metabolic Panel: Recent Labs  Lab 06/12/19 0639 06/12/19 0911 06/13/19 0632  NA 128*  --  132*  K 4.0  --  4.4  CL 94*  --  103  CO2 23  --  21*  GLUCOSE 238*  --  66*  BUN 7*  --  8  CREATININE 0.77  --  0.84  CALCIUM 8.9  --  7.9*  MG  --  1.7  --   PHOS  --  3.4  --    Liver Function Tests: Recent Labs  Lab 06/13/19 0632  AST 54*  ALT 29  ALKPHOS 85  BILITOT 0.6  PROT 5.9*  ALBUMIN 2.3*   Recent Labs  Lab 06/12/19 0639  LIPASE 32   No results for input(s): AMMONIA in the last 168 hours. CBC: Recent Labs  Lab 06/12/19 0639 06/13/19 0632  WBC 9.7 8.0  HGB 13.8 10.6*  HCT 39.2 30.3*  MCV 93.3 95.0  PLT 313 239   Cardiac Enzymes: No results for input(s): CKTOTAL, CKMB, CKMBINDEX, TROPONINI in the last 168 hours. BNP: Invalid input(s): POCBNP CBG: Recent Labs  Lab 06/13/19 0808 06/13/19 1010  GLUCAP 53* 132*   D-Dimer No results for input(s): DDIMER in the last 72 hours. Hgb A1c No results for input(s): HGBA1C in the last 72 hours. Lipid Profile No results for input(s): CHOL, HDL, LDLCALC, TRIG, CHOLHDL, LDLDIRECT in the last 72 hours. Thyroid function studies No results for input(s): TSH, T4TOTAL, T3FREE, THYROIDAB in the last 72 hours.  Invalid input(s): FREET3 Anemia work up No results for  input(s): VITAMINB12, FOLATE, FERRITIN, TIBC, IRON, RETICCTPCT in the last 72 hours. Urinalysis    Component Value Date/Time   COLORURINE YELLOW (A) 09/10/2018 1620   APPEARANCEUR CLEAR (A) 09/10/2018 1620   APPEARANCEUR Cloudy 10/12/2011 1520   LABSPEC 1.009 09/10/2018 1620   LABSPEC 1.016 10/12/2011 1520   PHURINE 7.0 09/10/2018 1620   GLUCOSEU NEGATIVE 09/10/2018 1620   GLUCOSEU Negative 10/12/2011 1520   HGBUR NEGATIVE 09/10/2018 [place] 09/10/2018 1620   BILIRUBINUR Negative 10/12/2011 [place] 09/10/2018 1620   PROTEINUR NEGATIVE 09/10/2018 1620   NITRITE NEGATIVE 09/10/2018 1620   LEUKOCYTESUR NEGATIVE 09/10/2018 1620   LEUKOCYTESUR 3+  10/12/2011 1520   Sepsis Labs Invalid input(s): PROCALCITONIN,  WBC,  LACTICIDVEN Microbiology Recent Results (from the past 240 hour(s))  SARS Coronavirus 2 by RT PCR (hospital order, performed in [facility] hospital lab) Nasopharyngeal Nasopharyngeal Swab     Status: None   Collection Time: 06/12/19 11:28 AM   Specimen: Nasopharyngeal Swab  Result Value Ref Range Status   SARS Coronavirus 2 NEGATIVE NEGATIVE Final    Comment: (NOTE) SARS-CoV-2 target nucleic acids are NOT DETECTED. The SARS-CoV-2 RNA is generally detectable in upper and lower respiratory specimens during the acute phase of infection. The lowest concentration of SARS-CoV-2 viral copies this assay can detect is 250 copies / mL. A negative result does not preclude SARS-CoV-2 infection and should not be used as the sole basis for treatment or other patient management decisions.  A negative result may occur with improper specimen collection / handling, submission of specimen other than nasopharyngeal swab, presence of viral mutation(s) within the areas targeted by this assay, and inadequate number of viral copies (<250 copies / mL). A negative result must be combined with clinical observations, patient history, and epidemiological  information. Fact Sheet for Patients:   BoilerBrush.com.cy Fact Sheet for Healthcare Providers: https://pope.com/ This test is not yet approved or cleared  by the [place] FDA and has been authorized for detection and/or diagnosis of SARS-CoV-2 by FDA under an Emergency Use Authorization (EUA).  This EUA will remain in effect (meaning this test can be used) for the duration of the COVID-19 declaration under Section 564(b)(1) of the Act, 21 U.S.C. section 360bbb-3(b)(1), unless the authorization is terminated or revoked sooner. Performed at [facility], [address] [place], [place] [address]     Time coordinating discharge: Over 30 minutes  SIGNED:  Arnetha Courser, MD  [facility] 06/13/2019, 1:58 PM  If 7PM-7AM, please contact night-coverage [email or web address]  This record has been created using Conservation officer, historic buildings. Errors have been sought and corrected,but may not always be located. Such creation errors do not reflect on the standard of care.

## 2019-08-09 ENCOUNTER — Encounter: Payer: Self-pay | Admitting: Emergency Medicine

## 2019-08-09 ENCOUNTER — Emergency Department
Admission: EM | Admit: 2019-08-09 | Discharge: 2019-08-09 | Disposition: A | Discharge status: Home / Self Care | Payer: Medicaid Other | Attending: Emergency Medicine | Admitting: Emergency Medicine

## 2019-08-09 ENCOUNTER — Other Ambulatory Visit: Payer: Self-pay

## 2019-08-09 DIAGNOSIS — F17210 Nicotine dependence, cigarettes, uncomplicated: Secondary | ICD-10-CM | POA: Diagnosis not present

## 2019-08-09 DIAGNOSIS — M25562 Pain in left knee: Secondary | ICD-10-CM | POA: Diagnosis present

## 2019-08-09 MED ORDER — OXYCODONE-ACETAMINOPHEN 5-325 MG PO TABS
1.0000 | ORAL_TABLET | Freq: Once | ORAL | Status: AC
  Administered 2019-08-09: 1 via ORAL
  Filled 2019-08-09: qty 1

## 2019-08-09 MED ORDER — KETOROLAC TROMETHAMINE 30 MG/ML IJ SOLN
30.0000 mg | Freq: Once | INTRAMUSCULAR | Status: AC
  Administered 2019-08-09: 30 mg via INTRAMUSCULAR
  Filled 2019-08-09: qty 1

## 2019-08-09 MED ORDER — PREDNISONE 10 MG PO TABS
ORAL_TABLET | ORAL | 0 refills | Status: DC
Start: 2019-08-09 — End: 2020-10-12

## 2019-08-09 MED ORDER — HYDROCODONE-ACETAMINOPHEN 5-325 MG PO TABS: 1.0000 | ORAL_TABLET | ORAL | 0 refills | Status: DC | PRN

## 2019-08-09 NOTE — ED Triage Notes (Signed)
hisotry of knee pain and used to get injections for this, but with covid has not been able to get them.  Swelling to left knee

## 2019-08-09 NOTE — Discharge Instructions (Addendum)
Follow-up with your primary care provider or the hospital if any continued problems.  Begin taking medication as directed.  You should follow-up with your primary care provider if not improving.  You may also be seen at the [facility] for this as well as Dr. Joice Lofts who is the orthopedist on-call today.

## 2019-08-09 NOTE — ED Notes (Signed)
See triage note. Presents with pain and swelling to left knee  States pain became worse yesterday  Denies any injury  Hx of gout but states this does not feel the same  Unable to bear wt

## 2019-08-09 NOTE — ED Provider Notes (Signed)
[facility] Emergency Department Provider Note   ____________________________________________   First MD Initiated Contact with Patient 08/09/19 [phone]     (approximate)  I have reviewed the triage vital signs and the nursing notes.   HISTORY  Chief Complaint Knee Pain   HPI Philip GACEK Sr. is a 59 y.o. male   Presents to the ED with complaint of pain and swelling to his left knee.  There is no history of injury to his knee.  Patient has a history of gout and also osteoarthritis to his left knee.  He states that he was getting steroid injections but since Covid has not been able to get one.  He has not been take any over-the-counter medication for his pain.  He rates pain as 10/10.      Past Medical History:  Diagnosis Date  . Arthritis   . Gout   . Pancreatitis     Patient Active Problem List   Diagnosis Date Noted  . Closed rib fracture 06/12/2019  . Chest wall pain 06/12/2019  . Tobacco abuse 06/12/2019  . Alcohol abuse 06/12/2019  . Pancreatitis, recurrent   . Abdominal pain   . Hyponatremia   . Duodenitis     Past Surgical History:  Procedure Laterality Date  . WRIST SURGERY      Prior to Admission medications   Medication Sig Start Date End Date Taking? Authorizing Provider  HYDROcodone-acetaminophen (NORCO/VICODIN) 5-325 MG tablet Take 1 tablet by mouth every 4 (four) hours as needed for moderate pain. 08/09/19   Tommi Rumps, PA-C  Multiple Vitamin (MULTIVITAMIN WITH MINERALS) TABS tablet Take 1 tablet by mouth daily. 06/14/19   Arnetha Courser, MD  predniSONE (DELTASONE) 10 MG tablet Take 6 tablets  today, on day 2 take 5 tablets, day 3 take 4 tablets, day 4 take 3 tablets, day 5 take  2 tablets and 1 tablet the last day 08/09/19   Tommi Rumps, PA-C    Allergies Patient has no known allergies.  No family history on file.  Social History Social History   Tobacco Use  . Smoking status: Current Every Day Smoker     Packs/day: 1.00    Types: Cigarettes  . Smokeless tobacco: Never Used  Vaping Use  . Vaping Use: Never used  Substance Use Topics  . Alcohol use: Yes    Alcohol/week: 4.0 standard drinks    Types: 4 Cans of beer per week  . Drug use: No    Review of Systems Constitutional: No fever/chills Eyes: No visual changes. Cardiovascular: Denies chest pain. Respiratory: Denies shortness of breath. Gastrointestinal: No abdominal pain.  No nausea, no vomiting.  Musculoskeletal: Positive left knee pain.  History of gout, history of osteoarthritis, left knee. Skin: Negative for rash. Neurological: Negative for headaches, focal weakness or numbness.  ____________________________________________   PHYSICAL EXAM:  VITAL SIGNS: ED Triage Vitals  Enc Vitals Group     BP 08/09/19 0912 134/90     Pulse Rate 08/09/19 0912 85     Resp 08/09/19 0912 16     Temp 08/09/19 0912 98 F (36.7 C)     Temp Source 08/09/19 0912 Oral     SpO2 08/09/19 0912 100 %     Weight 08/09/19 0908 160 lb (72.6 kg)     Height 08/09/19 0908 5\' 10"  (1.778 m)     Head Circumference --      Peak Flow --      Pain Score 08/09/19  0908 10     Pain Loc --      Pain Edu? --      Excl. in GC? --     Constitutional: Alert and oriented. Well appearing and in no acute distress. Eyes: Conjunctivae are normal.  Head: Atraumatic. Neck: No stridor.   Cardiovascular: Normal rate, regular rhythm. Grossly normal heart sounds.  Good peripheral circulation. Respiratory: Normal respiratory effort.  No retractions. Lungs CTAB. Musculoskeletal: On examination left knee there are degenerative changes from his osteoarthritis.  Area is warm and tender to touch.  No effusion present.  Skin is intact.  Pulses distally are intact.  Motor sensory function intact. Neurologic:  Normal speech and language. No gross focal neurologic deficits are appreciated.  Skin:  Skin is warm, dry and intact. No rash noted. Psychiatric: Mood and affect  are normal. Speech and behavior are normal.  ____________________________________________   LABS (all labs ordered are listed, but only abnormal results are displayed)  Labs Reviewed - No data to display   RADIOLOGY No new x-rays were taken of the left knee since there is no recent injury.  X-rays of the left knee from 2016 were reviewed which showed severe osteoarthritis and a bipartite patella. ____________________________________________   PROCEDURES  Procedure(s) performed (including Critical Care):  Procedures   ____________________________________________   INITIAL IMPRESSION / ASSESSMENT AND PLAN / ED COURSE  As part of my medical decision making, I reviewed the following data within the electronic MEDICAL RECORD NUMBER Notes from prior ED visits and [place] Controlled Substance Database  59 year old male presents to the ED with complaint of left knee pain without history of injury.  Patient has had a history of osteoarthritis and been seen in the ED as well as a history of gout.  Exam of the left knee shows moderate degenerative changes.  Area with slight warmth but no effusion noted.  Moderately tender to palpation.  Patient was given Toradol 30 mg IM and a Percocet for pain.  Patient was discharged with prescription for prednisone 60 mg taper for the next 6 days and Norco as needed for pain.  Patient is to follow-up with the [facility] if any continued problems or return to the emergency department.  ____________________________________________   FINAL CLINICAL IMPRESSION(S) / ED DIAGNOSES  Final diagnoses:  Left knee pain, unspecified chronicity     ED Discharge Orders         Ordered    predniSONE (DELTASONE) 10 MG tablet     Discontinue  Reprint     08/09/19 1115    HYDROcodone-acetaminophen (NORCO/VICODIN) 5-325 MG tablet  Every 4 hours PRN     Discontinue  Reprint     08/09/19 1115           Note:  This document was prepared using Dragon voice recognition  software and may include unintentional dictation errors.    Tommi Rumps, PA-C 08/09/19 1306    Shaune Pollack, MD 08/10/19 1500

## 2019-09-27 ENCOUNTER — Ambulatory Visit
Admission: RE | Admit: 2019-09-27 | Discharge: 2019-09-27 | Disposition: A | Discharge status: Home / Self Care | Payer: Disability Insurance | Attending: Thoracic Surgery | Admitting: Thoracic Surgery

## 2019-09-27 ENCOUNTER — Ambulatory Visit
Admission: RE | Admit: 2019-09-27 | Discharge: 2019-09-27 | Disposition: A | Discharge status: Home / Self Care | Payer: Disability Insurance | Source: Ambulatory Visit | Attending: Thoracic Surgery | Admitting: Thoracic Surgery

## 2019-09-27 ENCOUNTER — Other Ambulatory Visit: Payer: Self-pay | Admitting: Thoracic Surgery

## 2019-09-27 ENCOUNTER — Other Ambulatory Visit: Payer: Self-pay

## 2019-09-27 DIAGNOSIS — M25561 Pain in right knee: Secondary | ICD-10-CM | POA: Insufficient documentation

## 2019-09-27 DIAGNOSIS — M25531 Pain in right wrist: Secondary | ICD-10-CM | POA: Insufficient documentation

## 2020-10-12 ENCOUNTER — Emergency Department
Admission: EM | Admit: 2020-10-12 | Discharge: 2020-10-12 | Disposition: A | Discharge status: Home / Self Care | Payer: Medicaid Other | Attending: Emergency Medicine | Admitting: Emergency Medicine

## 2020-10-12 ENCOUNTER — Other Ambulatory Visit: Payer: Self-pay

## 2020-10-12 ENCOUNTER — Emergency Department: Payer: Medicaid Other

## 2020-10-12 ENCOUNTER — Encounter: Payer: Self-pay | Admitting: Emergency Medicine

## 2020-10-12 DIAGNOSIS — M109 Gout, unspecified: Secondary | ICD-10-CM | POA: Insufficient documentation

## 2020-10-12 DIAGNOSIS — F17210 Nicotine dependence, cigarettes, uncomplicated: Secondary | ICD-10-CM | POA: Diagnosis not present

## 2020-10-12 DIAGNOSIS — M25562 Pain in left knee: Secondary | ICD-10-CM | POA: Diagnosis present

## 2020-10-12 LAB — SYNOVIAL CELL COUNT + DIFF, W/ CRYSTALS
Eosinophils-Synovial: 0 %
Lymphocytes-Synovial Fld: 2 %
Monocyte-Macrophage-Synovial Fluid: 12 %
Neutrophil, Synovial: 86 %
WBC, Synovial: 63704 /mm3 — ABNORMAL HIGH (ref 0–200)

## 2020-10-12 MED ORDER — IBUPROFEN 800 MG PO TABS
800.0000 mg | ORAL_TABLET | Freq: Three times a day (TID) | ORAL | 0 refills | Status: AC | PRN
Start: 2020-10-12 — End: ?

## 2020-10-12 MED ORDER — PREDNISONE 50 MG PO TABS
ORAL_TABLET | ORAL | 0 refills | Status: AC
Start: 2020-10-12 — End: ?

## 2020-10-12 MED ORDER — LIDOCAINE-EPINEPHRINE (PF) 1 %-1:200000 IJ SOLN
30.0000 mL | Freq: Once | INTRAMUSCULAR | Status: AC
  Administered 2020-10-12: 30 mL
  Filled 2020-10-12: qty 30

## 2020-10-12 MED ORDER — PREDNISONE 20 MG PO TABS
60.0000 mg | ORAL_TABLET | Freq: Once | ORAL | Status: AC
  Administered 2020-10-12: 60 mg via ORAL
  Filled 2020-10-12: qty 3

## 2020-10-12 MED ORDER — OXYCODONE-ACETAMINOPHEN 5-325 MG PO TABS
1.0000 | ORAL_TABLET | Freq: Once | ORAL | Status: AC
  Administered 2020-10-12: 1 via ORAL
  Filled 2020-10-12: qty 1

## 2020-10-12 MED ORDER — KETOROLAC TROMETHAMINE 30 MG/ML IJ SOLN
INTRAMUSCULAR | Status: AC
  Administered 2020-10-12: 15 mg via INTRAMUSCULAR
  Filled 2020-10-12: qty 1

## 2020-10-12 MED ORDER — KETOROLAC TROMETHAMINE 30 MG/ML IJ SOLN: 15.0000 mg | Freq: Once | INTRAMUSCULAR | Status: AC

## 2020-10-12 MED ORDER — KETOROLAC TROMETHAMINE 15 MG/ML IJ SOLN
15.0000 mg | Freq: Once | INTRAMUSCULAR | Status: DC
  Filled 2020-10-12: qty 1

## 2020-10-12 NOTE — ED Triage Notes (Signed)
Presents with pain to left knee since Friday  left knee is swollen and tender

## 2020-10-12 NOTE — Discharge Instructions (Addendum)
You most likely have gout in the knee.  Please take the steroids and ibuprofen daily which will help with your pain.  There is a chance that you have an infection as well in the knee.  Please follow-up with Dr. Odis Luster, the orthopedist, tomorrow in clinic for reevaluation to ensure that your pain and symptoms are not worsening.

## 2020-10-12 NOTE — ED Provider Notes (Addendum)
[facility]  ____________________________________________   Event Date/Time   First MD Initiated Contact with Patient 10/12/20 1025     (approximate)  I have reviewed the triage vital signs and the nursing notes.   HISTORY  Chief Complaint Knee Pain    HPI Philip FOLKS Sr. is a 60 y.o. male past medical history of gout, chronic pancreatitis who presents with left knee pain.  Symptoms started 3 days ago.  He denies any preceding trauma.  He has chills but no fevers.  Has had pain in the left knee prior.  Has never been told what the cause was.  Does have a history of gout but in his foot.  He denies ever having fluid sample from the knee.         Past Medical History:  Diagnosis Date   Arthritis    Gout    Pancreatitis     Patient Active Problem List   Diagnosis Date Noted   Closed rib fracture 06/12/2019   Chest wall pain 06/12/2019   Tobacco abuse 06/12/2019   Alcohol abuse 06/12/2019   Pancreatitis, recurrent    Abdominal pain    Hyponatremia    Duodenitis     Past Surgical History:  Procedure Laterality Date   WRIST SURGERY      Prior to Admission medications   Medication Sig Start Date End Date Taking? Authorizing Provider  ibuprofen (ADVIL) 800 MG tablet Take 1 tablet (800 mg total) by mouth every 8 (eight) hours as needed. 10/12/20  Yes Georga Hacking, MD  predniSONE (DELTASONE) 50 MG tablet Take 1 pill daily 10/12/20  Yes Georga Hacking, MD  Multiple Vitamin (MULTIVITAMIN WITH MINERALS) TABS tablet Take 1 tablet by mouth daily. 06/14/19   Arnetha Courser, MD    Allergies Patient has no known allergies.  No family history on file.  Social History Social History   Tobacco Use   Smoking status: Every Day    Packs/day: 1.00    Types: Cigarettes   Smokeless tobacco: Never  Vaping Use   Vaping Use: Never used  Substance Use Topics   Alcohol use: Yes    Alcohol/week: 4.0 standard drinks    Types: 4 Cans of beer  per week   Drug use: No    Review of Systems   Review of Systems  Constitutional:  Positive for chills. Negative for fever.  Musculoskeletal:  Positive for arthralgias and myalgias.  All other systems reviewed and are negative.  Physical Exam Updated Vital Signs BP (!) 146/96 (BP Location: Right Arm)   Pulse 88   Temp 98 F (36.7 C) (Oral)   Resp 18   Ht 5\' 10"  (1.778 m)   Wt 73 kg   SpO2 98%   BMI 23.09 kg/m   Physical Exam Vitals and nursing note reviewed.  Constitutional:      General: He is not in acute distress.    Appearance: Normal appearance.  HENT:     Head: Normocephalic and atraumatic.  Eyes:     General: No scleral icterus.    Conjunctiva/sclera: Conjunctivae normal.  Pulmonary:     Effort: Pulmonary effort is normal. No respiratory distress.     Breath sounds: Normal breath sounds. No wheezing.  Musculoskeletal:        General: No deformity or signs of injury.     Cervical back: Normal range of motion.     Comments: Left knee is swollen with significant effusion, warm, tender to palpation,  pain with micro movements.  Skin:    Coloration: Skin is not jaundiced or pale.  Neurological:     General: No focal deficit present.     Mental Status: He is alert and oriented to person, place, and time. Mental status is at baseline.  Psychiatric:        Mood and Affect: Mood normal.        Behavior: Behavior normal.     LABS (all labs ordered are listed, but only abnormal results are displayed)  Labs Reviewed  SYNOVIAL CELL COUNT + DIFF, W/ CRYSTALS - Abnormal; Notable for the following components:      Result Value   Appearance-Synovial TURBID (*)    WBC, Synovial 63,704 (*)    All other components within normal limits  BODY FLUID CULTURE W GRAM STAIN   ____________________________________________  EKG  N/a ____________________________________________  RADIOLOGY Ky Barban, personally viewed and evaluated these images (plain  radiographs) as part of my medical decision making, as well as reviewing the written report by the radiologist.  ED MD interpretation: Reviewed the x-ray of the left knee which shows osteoarthritis but no fracture or dislocation    ____________________________________________   PROCEDURES  Procedure(s) performed (including Critical Care):  .Joint Aspiration/Arthrocentesis  Date/Time: 10/12/2020 5:52 PM Performed by: Georga Hacking, MD Authorized by: Georga Hacking, MD   Consent:    Consent obtained:  Verbal   Risks discussed:  Infection and pain   Alternatives discussed:  No treatment Universal protocol:    Patient identity confirmed:  Verbally with patient Location:    Location:  Knee   Knee:  L knee Anesthesia:    Anesthesia method:  Local infiltration   Local anesthetic:  Lidocaine 1% WITH epi Procedure details:    Preparation: Patient was prepped and draped in usual sterile fashion     Needle gauge:  18 G   Ultrasound guidance: no     Approach:  Lateral   Aspirate amount:  50   Aspirate characteristics:  Serous   Steroid injected: no     Specimen collected: yes   Post-procedure details:    Dressing:  Adhesive bandage   Procedure completion:  Tolerated   ____________________________________________   INITIAL IMPRESSION / ASSESSMENT AND PLAN / ED COURSE     60 year old male presents with left knee pain and swelling.  No associated trauma.  He is afebrile.  On exam the knee is significantly swollen with effusion and is warm and tender.  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  Synovial fluid has monosodium urate crystals, there is also 63,000 white blood cells with 86% neutrophils.  Called the lab and confirmed that Gram stain will not be back until later tonight.  Suspect that this is just gout however with that high white blood cell count I did speak with Dr. Odis Luster with orthopedics who feels that it is  appropriate to give the patient a course of steroids and he will see him in the office tomorrow to ensure that it is not worsening.  I think this is reasonable given the patient is afebrile and that it is most likely to be gout.  Patient is aware of the plan and understands return precautions.      ____________________________________________   FINAL CLINICAL IMPRESSION(S) / ED DIAGNOSES  Final diagnoses:  Acute gout of left knee, unspecified cause     ED Discharge Orders          Ordered  predniSONE (DELTASONE) 50 MG tablet        10/12/20 1432    ibuprofen (ADVIL) 800 MG tablet  Every 8 hours PRN        10/12/20 1432             Note:  This document was prepared using Dragon voice recognition software and may include unintentional dictation errors.    Georga Hacking, MD 10/12/20 1433    Georga Hacking, MD 10/12/20 [phone]

## 2020-10-15 LAB — BODY FLUID CULTURE W GRAM STAIN: Culture: NO GROWTH

## 2021-12-02 IMAGING — CR DG KNEE 1-2V*R*
1 series · 2 of 2 positions shown · non-contrast
Comparison: None.

CLINICAL DATA: Chronic knee pain

EXAM:
RIGHT KNEE - 1-2 VIEW

[Series 1: dg knee 1-2 views right · 0.14mm/px · 2 of 2 slices shown]
[im 1/2]
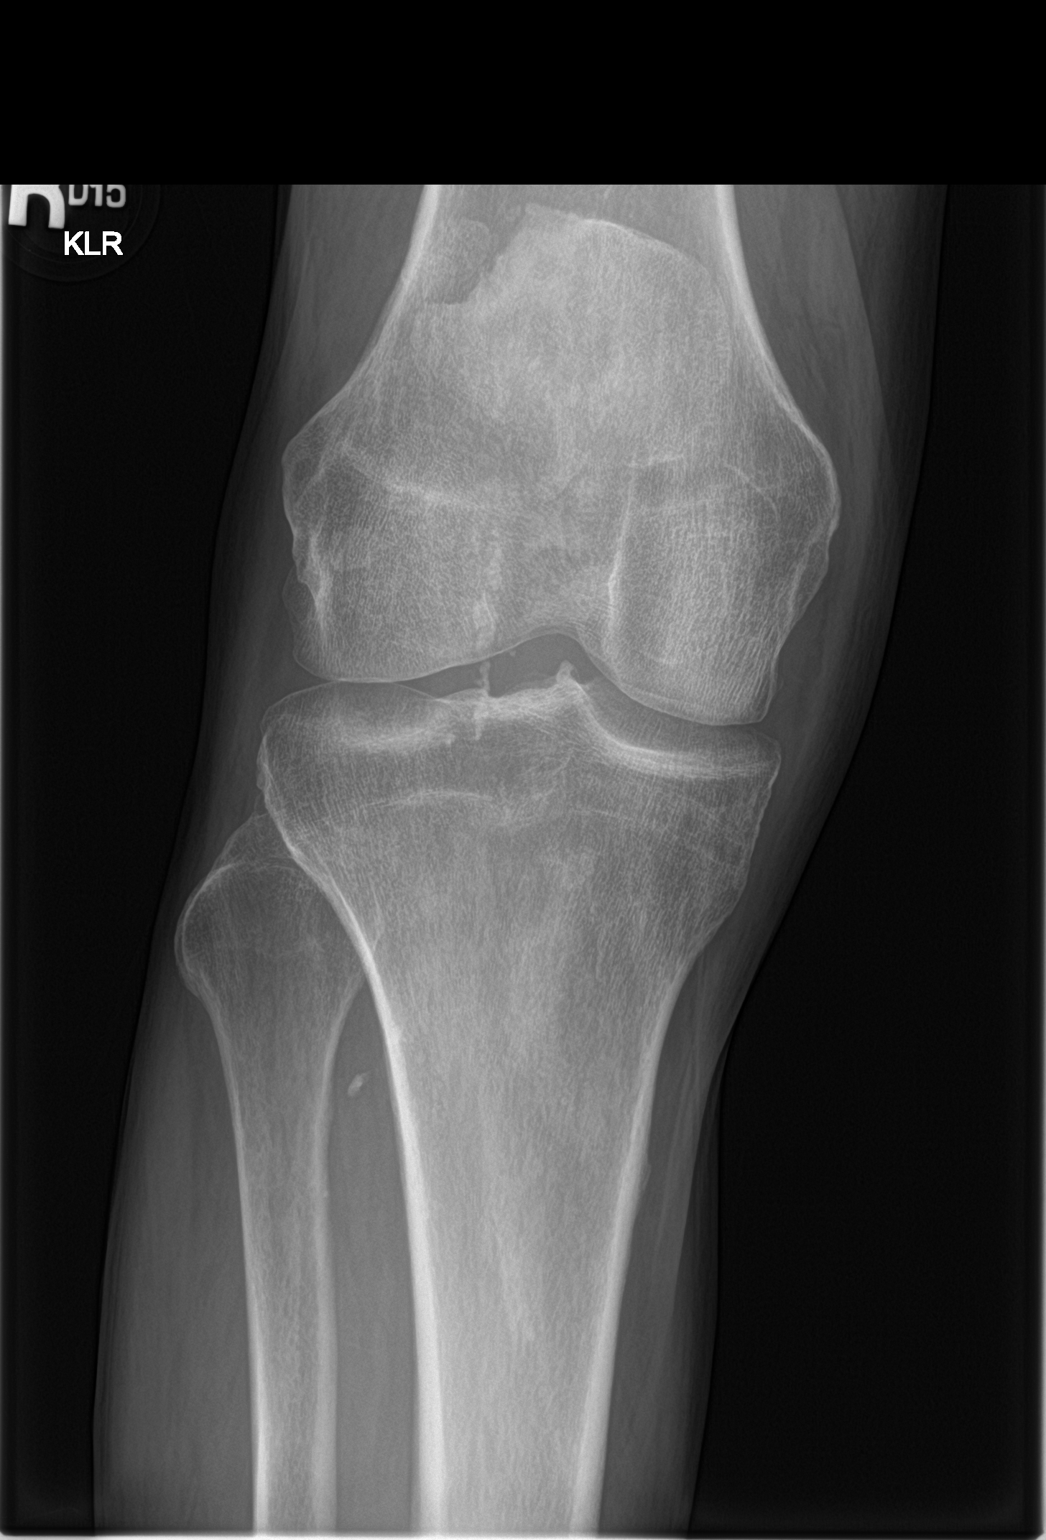
[im 2/2]
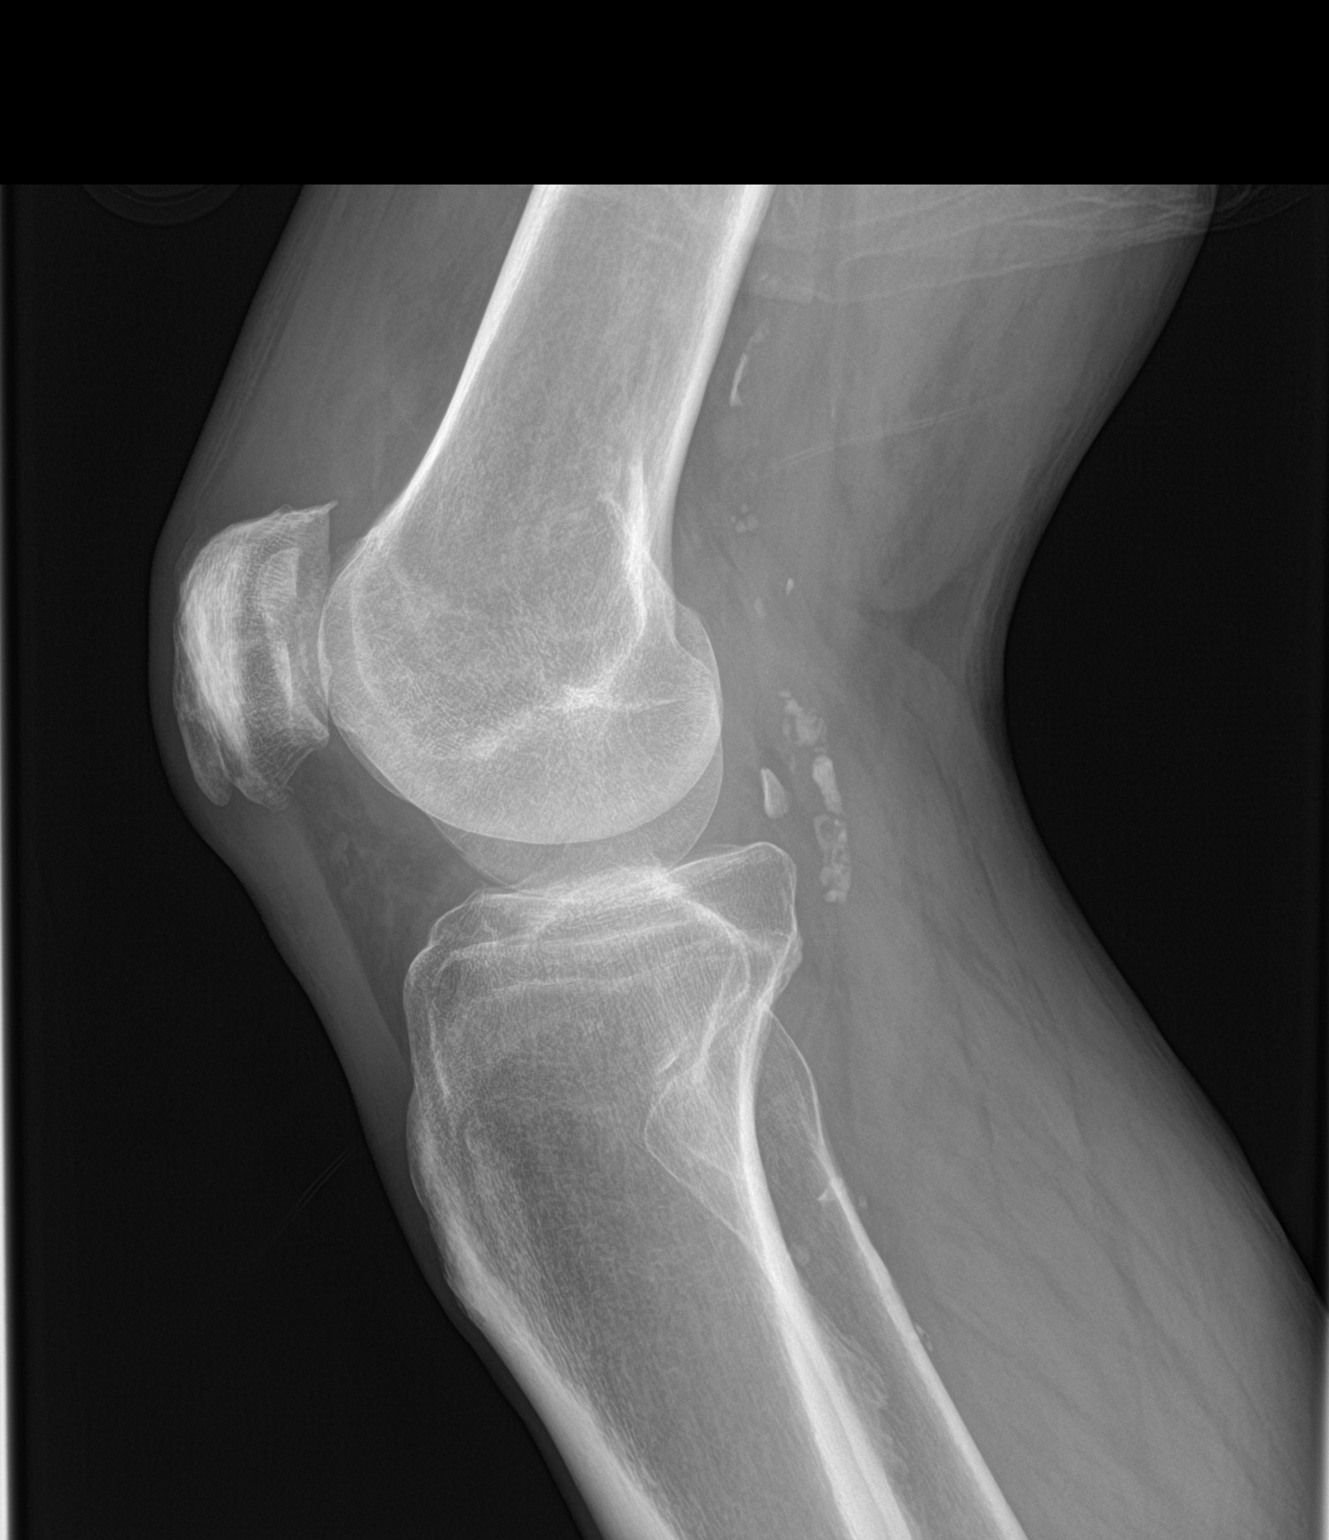

[2 of 2 positions shown; findings below may reference images not displayed]

FINDINGS: Small to moderate-sized joint effusion. Chondromalacia of the
patellofemoral joint. Weight-bearing compartments appear
unremarkable. Regional arterial calcification incidentally noted.
Bipartite patella, not clinically significant.
IMPRESSION: Small to moderate-sized joint effusion. Chondromalacia of the
patellofemoral joint. Bipartite patella. Weight-bearing compartments
appear unremarkable.

## 2021-12-02 IMAGING — CR DG WRIST 2V*R*
1 series · 2 of 2 positions shown · non-contrast
Comparison: 12/14/2007

CLINICAL DATA: Wrist pain

EXAM:
RIGHT WRIST - 2 VIEW

[Series 1: dg wrist 2 views right · 0.14mm/px · 2 of 2 slices shown]
[im 1/2]
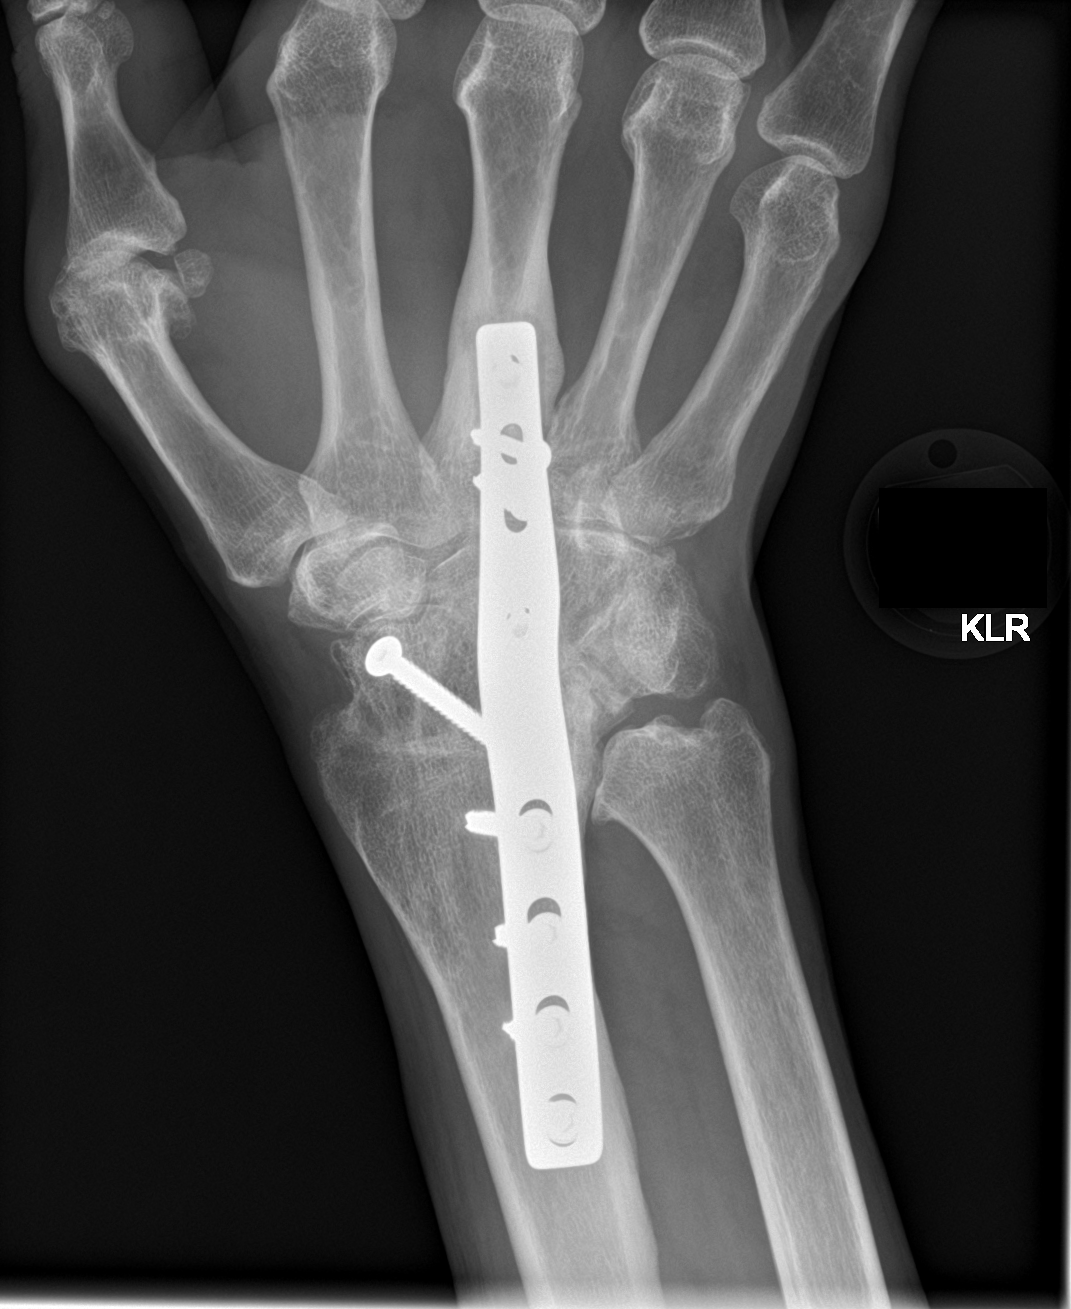
[im 2/2]
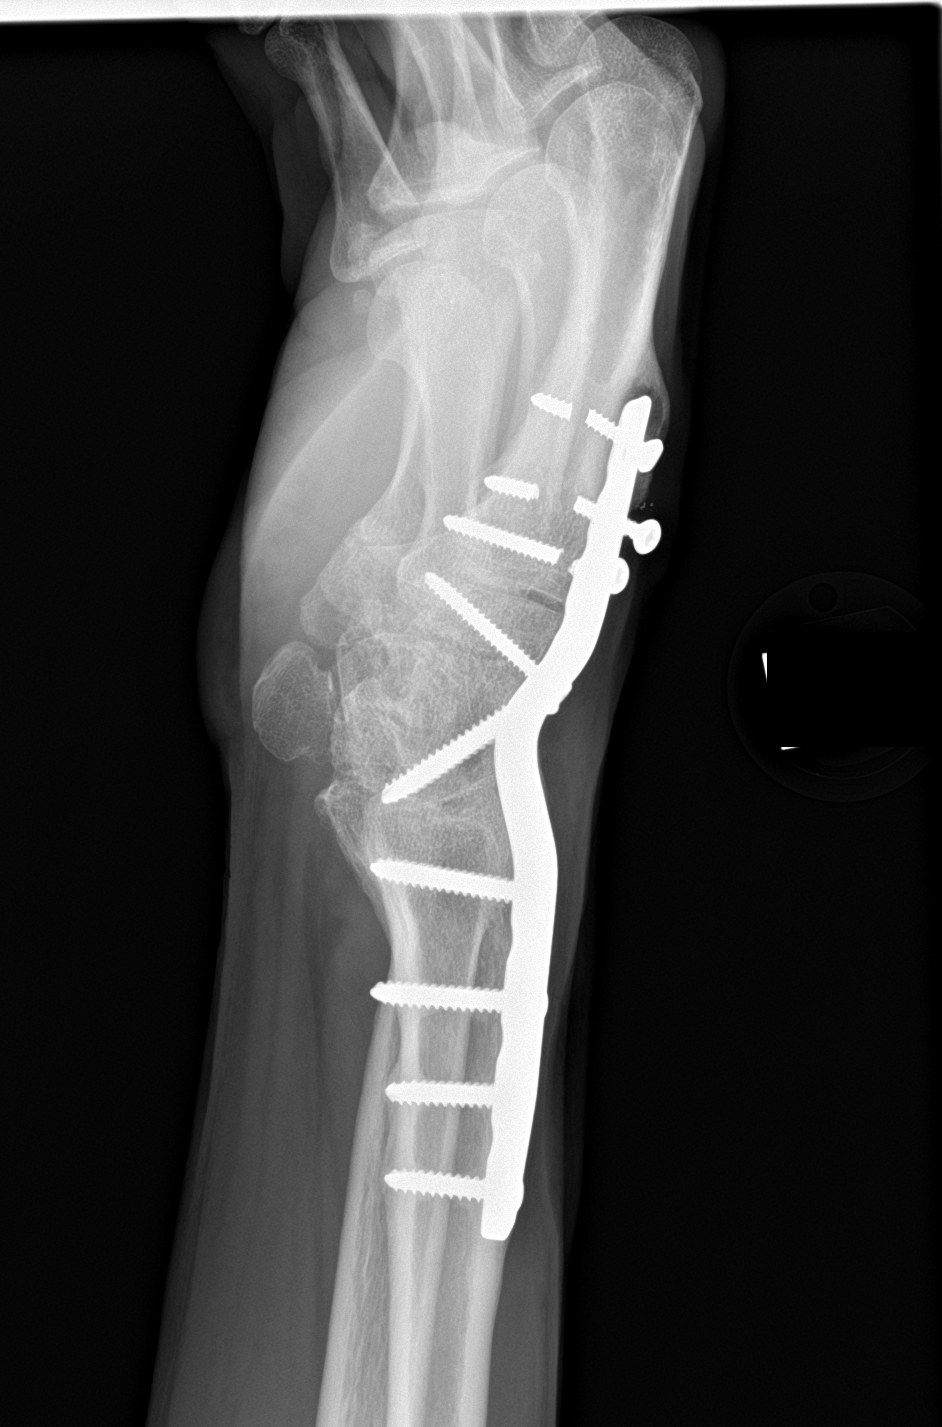

[2 of 2 positions shown; findings below may reference images not displayed]

FINDINGS: There has been a previous fusion procedure. Dorsal plate and screws
extend from the proximal third metacarpal, through the carpal region
to the distal diaphysis of the radius. There appears to be solid
fusion of the radius and most of the carpus. There is degenerative
change at the radioulnar articulation. Multi gland bones are not
fused. Fracture of the screws within the proximal third metatarsal.
Some lucency around the distal plate. This probably indicates
nonunion with the third metatarsal. Osteoarthritic changes seen at
the metacarpal phalangeal joint of the thumb.
IMPRESSION: 1. Previous fusion procedure. Fusion of the radius and most of the
carpus. Fracture of the screws within the proximal third metatarsal.
This probably indicates nonunion with the third metatarsal.
2. Degenerative changes at the radioulnar articulation and
metacarpal phalangeal joint of the.

## 2022-12-18 IMAGING — DX DG KNEE 1-2V*L*
2 series · 2 of 2 positions shown · non-contrast
Comparison: None.

CLINICAL DATA: knee pain

EXAM:
LEFT KNEE - 1-2 VIEW

[knee ap]
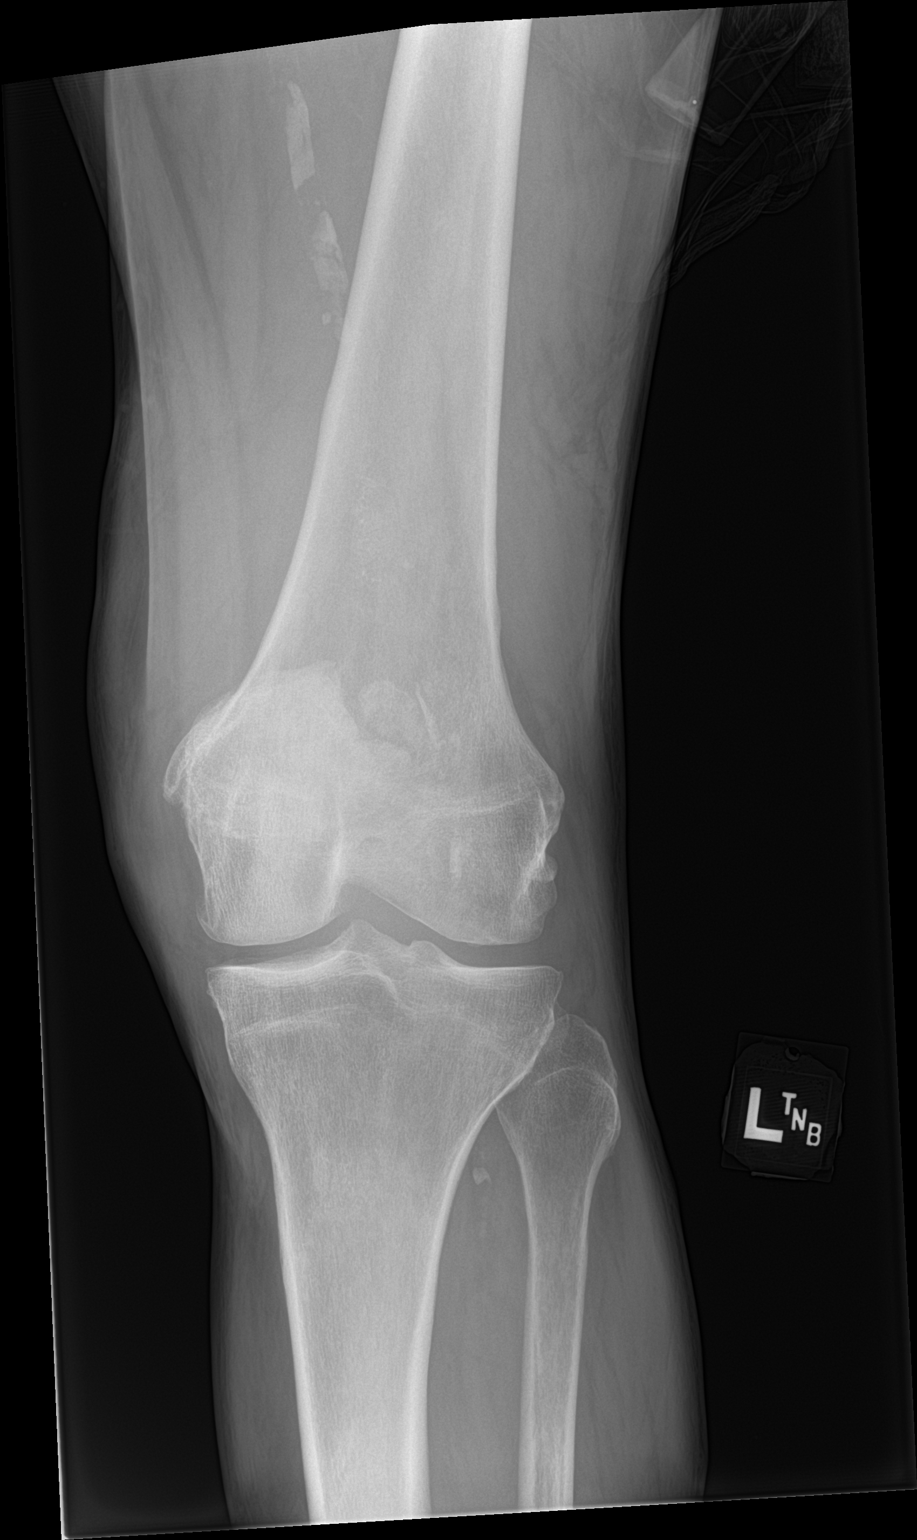

[knee lat]
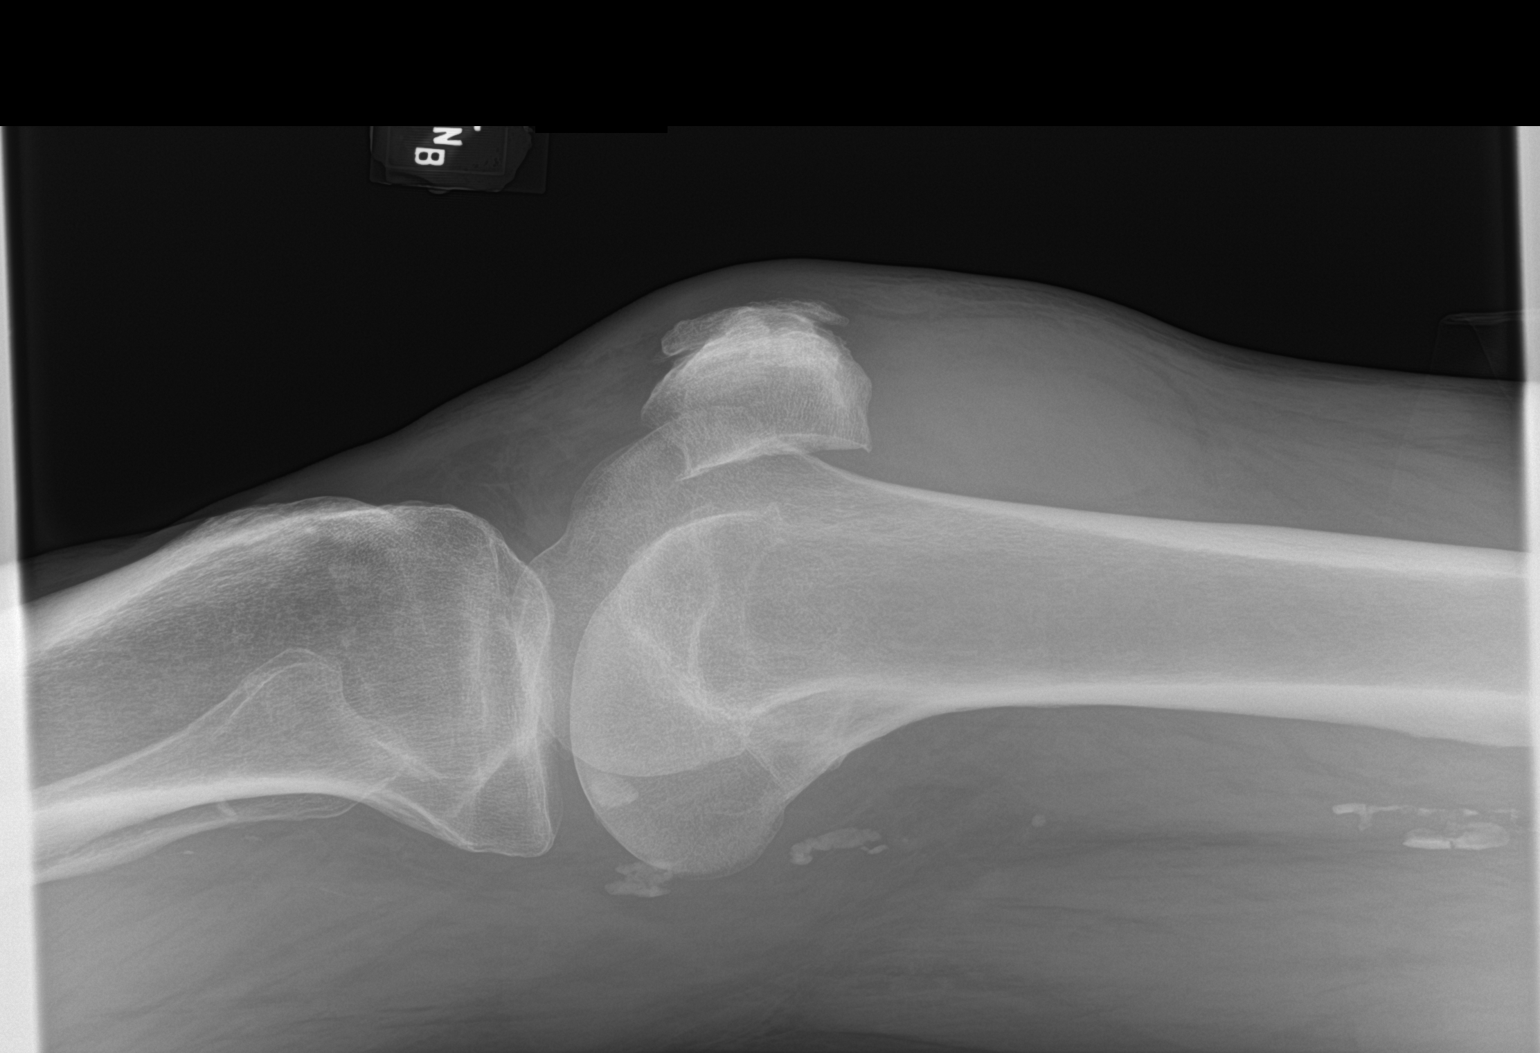

[2 of 2 positions shown; findings below may reference images not displayed]

FINDINGS: Normal alignment. No acute fracture. Tricompartmental degenerative
changes. Patellar enthesophytes. Normal mineralization. Vascular
calcifications. Large knee joint effusion.
IMPRESSION: Large knee joint effusion without underlying acute osseous
abnormality.

## 2024-01-25 DEATH — deceased
